# Patient Record
Sex: Female | Born: 1945 | Hispanic: No | Marital: Married | Smoking: Former smoker
Health system: Southern US, Community
[De-identification: ages and names within clinical notes are randomized; demographics above are authoritative.]

## PROBLEM LIST (undated history)

## (undated) DIAGNOSIS — K635 Polyp of colon: Secondary | ICD-10-CM

## (undated) DIAGNOSIS — Z5189 Encounter for other specified aftercare: Secondary | ICD-10-CM

## (undated) DIAGNOSIS — K219 Gastro-esophageal reflux disease without esophagitis: Secondary | ICD-10-CM

## (undated) DIAGNOSIS — E785 Hyperlipidemia, unspecified: Secondary | ICD-10-CM

## (undated) DIAGNOSIS — Z9981 Dependence on supplemental oxygen: Secondary | ICD-10-CM

## (undated) DIAGNOSIS — Z86718 Personal history of other venous thrombosis and embolism: Secondary | ICD-10-CM

## (undated) DIAGNOSIS — K648 Other hemorrhoids: Secondary | ICD-10-CM

## (undated) DIAGNOSIS — D649 Anemia, unspecified: Secondary | ICD-10-CM

## (undated) DIAGNOSIS — I10 Essential (primary) hypertension: Secondary | ICD-10-CM

## (undated) DIAGNOSIS — K5790 Diverticulosis of intestine, part unspecified, without perforation or abscess without bleeding: Secondary | ICD-10-CM

## (undated) HISTORY — PX: CARPAL TUNNEL RELEASE: SHX101

## (undated) HISTORY — DX: Gastro-esophageal reflux disease without esophagitis: K21.9

## (undated) HISTORY — DX: Encounter for other specified aftercare: Z51.89

## (undated) HISTORY — PX: APPENDECTOMY: SHX54

## (undated) HISTORY — DX: Essential (primary) hypertension: I10

## (undated) HISTORY — DX: Other hemorrhoids: K64.8

## (undated) HISTORY — PX: OTHER SURGICAL HISTORY: SHX169

## (undated) HISTORY — PX: TUBAL LIGATION: SHX77

## (undated) HISTORY — DX: Hyperlipidemia, unspecified: E78.5

## (undated) HISTORY — DX: Diverticulosis of intestine, part unspecified, without perforation or abscess without bleeding: K57.90

## (undated) HISTORY — PX: CARDIAC CATHETERIZATION: SHX172

## (undated) HISTORY — DX: Anemia, unspecified: D64.9

## (undated) HISTORY — DX: Polyp of colon: K63.5

## (undated) HISTORY — DX: Personal history of other venous thrombosis and embolism: Z86.718

---
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
## 1968-10-26 HISTORY — PX: OTHER SURGICAL HISTORY: SHX169

## 2007-04-26 ENCOUNTER — Encounter: Payer: Self-pay | Admitting: Cardiovascular Disease

## 2010-09-26 ENCOUNTER — Ambulatory Visit: Payer: Self-pay | Admitting: Family Medicine

## 2010-09-26 ENCOUNTER — Telehealth: Payer: Self-pay | Admitting: Family Medicine

## 2010-09-26 DIAGNOSIS — I10 Essential (primary) hypertension: Secondary | ICD-10-CM | POA: Insufficient documentation

## 2010-09-26 DIAGNOSIS — E785 Hyperlipidemia, unspecified: Secondary | ICD-10-CM | POA: Insufficient documentation

## 2010-09-26 DIAGNOSIS — F341 Dysthymic disorder: Secondary | ICD-10-CM

## 2010-09-26 DIAGNOSIS — M25519 Pain in unspecified shoulder: Secondary | ICD-10-CM

## 2010-09-26 DIAGNOSIS — M79609 Pain in unspecified limb: Secondary | ICD-10-CM

## 2010-10-21 ENCOUNTER — Encounter: Payer: Self-pay | Admitting: Family Medicine

## 2010-10-21 ENCOUNTER — Encounter
Admission: RE | Admit: 2010-10-21 | Discharge: 2010-10-21 | Payer: Self-pay | Source: Home / Self Care | Attending: Family Medicine | Admitting: Family Medicine

## 2010-10-22 LAB — CONVERTED CEMR LAB
ALT: 11 units/L (ref 0–35)
AST: 15 units/L (ref 0–37)
Albumin: 4.3 g/dL (ref 3.5–5.2)
Alkaline Phosphatase: 62 units/L (ref 39–117)
BUN: 20 mg/dL (ref 6–23)
Calcium: 8.8 mg/dL (ref 8.4–10.5)
Chloride: 106 meq/L (ref 96–112)
Glucose, Bld: 93 mg/dL (ref 70–99)
Total Bilirubin: 0.3 mg/dL (ref 0.3–1.2)
Triglycerides: 104 mg/dL (ref <150)

## 2010-11-25 NOTE — Progress Notes (Signed)
Summary: Xray  Phone Note Call from Patient Call back at Home Phone [phone]   Caller: Patient Call For: [place] Summary of Call: Pt calls and states she thought you were gonna give her an xray of arm to get done and doesn't have it with her. Please advise Initial call taken by: Kathlene November LPN,  September 26, 2010 11:03 AM  Follow-up for Phone Call        OK will faxorder. She can go anytime.  Follow-up by: Nani Gasser MD,  September 26, 2010 11:38 AM  Additional Follow-up for Phone Call Additional follow up Details #1::        Pt notified via VM Additional Follow-up by: Kathlene November LPN,  September 26, 2010 11:40 AM  New Problems: ARM PAIN (ICD-729.5)   New Problems: ARM PAIN (ICD-729.5)

## 2010-11-25 NOTE — Assessment & Plan Note (Signed)
Summary: NOV: HTN, lipids, shoulder pain, forearm pain   Vital Signs:  Patient profile:   65 year old female Height:      63.75 inches Weight:      147 pounds Pulse rate:   87 / minute BP sitting:   131 / 81  (right arm) Cuff size:   regular  Vitals Entered By: Avon Gully CMA, Duncan Dull) (September 26, 2010 10:10 AM) CC: NP est care   CC:  NP est care.  History of Present Illness: Tripped on the basement stairs and tripped nand hurt her ankle and also his her forearm. Very tender along her forearm.  Pain has not improved. Also her left upper arm is pianufl. Feels like a throbbing band and radiates up into her neck and will occ keeps her awake and cannot lay on that shoulder at night.  Painful to reach up but has normal ROM. Taking Ibuprofen - helps some.   Habits & Providers  Alcohol-Tobacco-Diet     Alcohol drinks/day: <1     Tobacco Status: quit     Year Quit: 1968  Exercise-Depression-Behavior     Does Patient Exercise: no     STD Risk: never     Drug Use: no     Seat Belt Use: always  Current Medications (verified): 1)  Gemfibrozil 600 Mg Tabs (Gemfibrozil) .... Take One Tablet By Mouth Twice Daily 2)  Prinivil 10 Mg Tabs (Lisinopril) 3)  Fluoxetine Hcl 20 Mg Caps (Fluoxetine Hcl) .... Take One Tablet By Mouth Once A Day  Allergies (verified): 1)  ! Penicillin G Potassium (Penicillin G Potassium) 2)  Simvastatin (Simvastatin)  Comments:  Nurse/Medical Assistant: The patient's medications and allergies were reviewed with the patient and were updated in the Medication and Allergy Lists. Avon Gully CMA, Duncan Dull) (September 26, 2010 10:13 AM)  Past History:  Past Surgical History: tubal ligation [place]  Varicose veins.  Right rotator cuff pain  Family History: Mother with Cancer, DM Father with MI  Social History: Retired.  College education. Married to [place].  Former Smoker, 1968 Alcohol use-yes Drug use-no Regular  exercise-no Smoking Status:  quit Does Patient Exercise:  no STD Risk:  never Drug Use:  no Seat Belt Use:  always  Review of Systems       No fever/sweats/weakness, unexplained weight loss/gain.  No vison changes.  + difficulty hearing/ringing in ears, hay fever/allergies.  + chest pain/discomfort.  No palpitations.  No Br lump/nipple discharge.  No cough/wheeze.  No blood in BM, nausea/vomiting/diarrhea.  + nighttime urination, leaking urine. No unusual vaginal bleeding, discharge (penis or vagina).  + muscle/joint pain. No rash, change in mole.  +  HA. No memory loss.  + anxiety. No sleep d/o, depression.  No easy bruising/bleeding, unexplained lump   Physical Exam  General:  Well-developed,well-nourished,in no acute distress; alert,appropriate and cooperative throughout examination Head:  Normocephalic and atraumatic without obvious abnormalities. No apparent alopecia or balding. Neck:  No deformities, masses, or tenderness noted.no thyromegaly. Lungs:  Normal respiratory effort, chest expands symmetrically. Lungs are clear to auscultation, no crackles or wheezes. Heart:  Normal rate and regular rhythm. S1 and S2 normal without gallop, murmur, click, rub or other extra sounds. the carotid or abdominal bruits Msk:  in bilateral shoulders with normal range of motion.  Strength 5/5 in the shoulders elbows wrists and the thumbs bilaterally.  She is some mild tenderness over the [facility] joint.  She is also tender over the proximal ulna near the  elbow.  She has pinpoint tenderness they are.  No swelling or other skin lesions.  Two the negative empty. can test.  Her neck is nontender.  Her upper cervical spine is slightly tender.  Nontender paraspinous muscles. Pulses:   radial pulses 2+ bilaterally Extremities:  no lower extremity edema Skin:  no rashes.   Cervical Nodes:  No lymphadenopathy noted Psych:  Cognition and judgment appear intact. Alert and cooperative with normal attention span and  concentration. No apparent delusions, illusions, hallucinations   Impression & Recommendations:  Problem # 1:  HYPERTENSION, BENIGN (ICD-401.1) is well controlled today.  sent over refills.she is due for repeat lab work in about a week for her cholesterol Her updated medication list for this problem includes:    Lisinopril 10 Mg Tabs (Lisinopril) .Marland Kitchen... Take 1 tablet by mouth once a day  Orders: T-Comprehensive Metabolic Panel [phone]) T-Lipid Profile ([id number])  Problem # 2:  HYPERLIPIDEMIA (ICD-272.4) repeat lab work in one week for cholesterol.  She did not tolerate simvastatin well. Her updated medication list for this problem includes:    Gemfibrozil 600 Mg Tabs (Gemfibrozil) .Marland Kitchen... Take one tablet by mouth twice daily  Orders: T-Comprehensive Metabolic Panel [id number]) T-Lipid Profile ([id number])  Problem # 3:  SHOULDER PAIN (ICD-719.41) I discussed the suspect that she has subacromial bursitis.  Will give her a handout on exercises to do for her shoulder.  Also for her lower forearm we can do an x-ray to rule out a bone chip. In the meantime she can your certainly use anti-inflammatory p.r.n. with food and water to improve her pain  Problem # 4:  ANXIETY DEPRESSION (ICD-300.4) she is stable and happy with her current regimen.  Problem # 5:  ARM PAIN (ICD-729.5) will get an x-ray to rule out a chip off of the bone.  It is unusual to still be tender 6 months later for just a soft tissue injury.  Complete Medication List: 1)  Gemfibrozil 600 Mg Tabs (Gemfibrozil) .... Take one tablet by mouth twice daily 2)  Lisinopril 10 Mg Tabs (Lisinopril) .... Take 1 tablet by mouth once a day 3)  Fluoxetine Hcl 20 Mg Caps (Fluoxetine hcl) .... Take one tablet by mouth once a day  Other Orders: Flu Vaccine 40yrs + MEDICARE PATIENTS (M8413) Administration Flu vaccine - MCR (K4401)  Patient Instructions: 1)  Please schedule a follow-up appointment in 3-4 months for blood  pressure and we can hopefully get you up to date on some of your screening/preventative care.  2)  We will call you when we get your lab results. .  Prescriptions: LISINOPRIL 10 MG TABS (LISINOPRIL) Take 1 tablet by mouth once a day  #90 x 1   Entered and Authorized by:   Nani Gasser MD   Signed by:   Nani Gasser MD on 09/26/2010   Method used:   Electronically to        [facility] [phone]* (retail)       [address] [place], [place]  [address]       Ph: [phone]       Fax: [phone]   RxID:   [id number] FLUOXETINE HCL 20 MG CAPS (FLUOXETINE HCL) take one tablet by mouth once a day  #90 x 1   Entered and Authorized by:   Nani Gasser MD   Signed by:   Nani Gasser MD on 09/26/2010   Method used:   Electronically to  [address] [phone]* (retail)       [address] [place], [place]  [address]       Ph: [phone]       Fax: [phone]   RxID:   [id number] GEMFIBROZIL 600 MG TABS (GEMFIBROZIL) take one tablet by mouth twice daily  #180 x 1   Entered and Authorized by:   Nani Gasser MD   Signed by:   Nani Gasser MD on 09/26/2010   Method used:   Electronically to        [facility] [phone]* (retail)       [address] [place], [place]  [address]       Ph: [phone]       Fax: [phone]   RxID:   [id number]    Orders Added: 1)  T-Comprehensive Metabolic Panel [[id number]] 2)  T-Lipid Profile [phone] 3)  Flu Vaccine 83yrs + MEDICARE PATIENTS [Q2039] 4)  Administration Flu vaccine - MCR [G0008] 5)  Est. Patient Level IV [35573] Flu Vaccine Consent Questions     Do you have a history of severe allergic reactions to this vaccine? no    Any prior history of allergic reactions to egg and/or gelatin? no    Do you have a sensitivity to the preservative Thimersol? no    Do you have a past history of Guillan-Barre Syndrome? no    Do you currently have an acute febrile illness? no     Have you ever had a severe reaction to latex? no    Vaccine information given and explained to patient? yes    Are you currently pregnant? no    Lot Number:[id number]   Exp Date:04/25/2011   Site Given  Left Deltoid IMu vaccine - MCR [G0008]     .lbmedflu   Appended Document: NOV: HTN, lipids, shoulder pain, forearm pain

## 2011-03-10 ENCOUNTER — Encounter: Payer: Self-pay | Admitting: Family Medicine

## 2011-03-12 ENCOUNTER — Telehealth: Payer: Self-pay | Admitting: Family Medicine

## 2011-03-12 ENCOUNTER — Encounter: Payer: Self-pay | Admitting: Family Medicine

## 2011-03-12 ENCOUNTER — Ambulatory Visit (INDEPENDENT_AMBULATORY_CARE_PROVIDER_SITE_OTHER): Payer: Medicare Other | Admitting: Family Medicine

## 2011-03-12 ENCOUNTER — Ambulatory Visit
Admission: RE | Admit: 2011-03-12 | Discharge: 2011-03-12 | Disposition: A | Discharge status: Home / Self Care | Payer: Medicare Other | Source: Ambulatory Visit | Attending: Family Medicine | Admitting: Family Medicine

## 2011-03-12 VITALS — BP 116/73 | HR 82 | Ht 63.0 in | Wt 144.0 lb

## 2011-03-12 DIAGNOSIS — Z9189 Other specified personal risk factors, not elsewhere classified: Secondary | ICD-10-CM

## 2011-03-12 DIAGNOSIS — Z1231 Encounter for screening mammogram for malignant neoplasm of breast: Secondary | ICD-10-CM

## 2011-03-12 DIAGNOSIS — Z789 Other specified health status: Secondary | ICD-10-CM

## 2011-03-12 DIAGNOSIS — R0602 Shortness of breath: Secondary | ICD-10-CM

## 2011-03-12 DIAGNOSIS — I10 Essential (primary) hypertension: Secondary | ICD-10-CM

## 2011-03-12 LAB — CBC
MCHC: 32.4 g/dL (ref 30.0–36.0)
Platelets: 380 10*3/uL (ref 150–400)

## 2011-03-12 NOTE — Patient Instructions (Signed)
Recheck Blood pressure in one month.   We will call you with the lab results and chest xray results.

## 2011-03-12 NOTE — Telephone Encounter (Signed)
Call pt: the cxr showed some interstitial markings. This could be some scar tissue. Does she know if she had a CXR in the area before? [facility]? If so then find out where and we can get try to get a copy on CD for the radiologist to compare and see if finding are new or not. If not then they recommend Chest CT for further evaluation.

## 2011-03-12 NOTE — Assessment & Plan Note (Signed)
Her blood pressure is normal but on the lower end of normal today. We can go ahead and hold the lisinopril for one month in followup for repeat blood pressure check in one month before she leaves for [place].

## 2011-03-12 NOTE — Progress Notes (Signed)
  Subjective:    Patient ID: Taylor Hall, female    DOB: [birth year], 65 y.o.   MRN: [id number]  HPI SOB for a couple of months.  Gets really SOB with walking on incline.  Occ dry cough.  No fever. Some recent ear infections.  She completed her antibiotics about a week ago. Only smoked as teenager. Had bronchitis as an infant. Mother with COPD (smoker).  Father with Lung Cancer (Black Lung). No CP or diaphoresis.  She doesn't notice the shortness of breath at night or at rest. No swelling of the extremities. No fever.  She had a cardiac cath several years ago and then was supposed to have a nuclear stress test but says she never went because of finances. She would like to sched that now.   Severe burning sensation in the right second toe.  It is intermittant. Next  She also feels her blood pressures been going too low on the lisinopril. She occasionally feels dizzy when she stands up.   Review of Systems     Objective:   Physical Exam  Constitutional: She is oriented to person, place, and time. She appears well-developed and well-nourished.  HENT:  Head: Normocephalic and atraumatic.  Right Ear: External ear normal.  Left Ear: External ear normal.  Nose: Nose normal.  Mouth/Throat: Oropharynx is clear and moist.       Right and left TM and canal are normal.   Eyes: Conjunctivae are normal. Pupils are equal, round, and reactive to light.  Neck: Neck supple. No thyromegaly present.  Cardiovascular: Normal rate, regular rhythm and normal heart sounds.   Pulmonary/Chest: Effort normal and breath sounds normal.  Lymphadenopathy:    She has no cervical adenopathy.  Neurological: She is alert and oriented to person, place, and time.  Skin: Skin is warm and dry.  Psychiatric: She has a normal mood and affect.          Assessment & Plan:  SOB- EKG is normla. Will get CXR to rule out infection or other lesions.. CHF is unlikely as she has no other symptoms of congestive heart failure..  I will check a CBC to rule out anemia. We will also check her thyroid level. Also a CMP to make sure her electrolytes kidney function and renal function are normal. EKG shows rate of 70 bpm, NSR, no acute changes.  Note, she is a very remote smoker.  We will also go ahead and schedule her mammogram and bone density test. She is also due for screening lab work. Her hypertension is well controlled.

## 2011-03-13 ENCOUNTER — Ambulatory Visit
Admission: RE | Admit: 2011-03-13 | Discharge: 2011-03-13 | Disposition: A | Discharge status: Home / Self Care | Payer: Medicare Other | Source: Ambulatory Visit | Attending: Family Medicine | Admitting: Family Medicine

## 2011-03-13 ENCOUNTER — Other Ambulatory Visit: Payer: Medicare Other

## 2011-03-13 ENCOUNTER — Telehealth: Payer: Self-pay | Admitting: Family Medicine

## 2011-03-13 DIAGNOSIS — R0602 Shortness of breath: Secondary | ICD-10-CM

## 2011-03-13 DIAGNOSIS — J849 Interstitial pulmonary disease, unspecified: Secondary | ICD-10-CM | POA: Insufficient documentation

## 2011-03-13 LAB — COMPLETE METABOLIC PANEL WITH GFR
ALT: 10 U/L (ref 0–35)
AST: 16 U/L (ref 0–37)
Albumin: 4.6 g/dL (ref 3.5–5.2)
CO2: 23 mEq/L (ref 19–32)
Calcium: 9.8 mg/dL (ref 8.4–10.5)
Chloride: 103 mEq/L (ref 96–112)
Creat: 0.84 mg/dL (ref 0.40–1.20)
GFR, Est African American: >60 mL/min (ref >60)
Glucose, Bld: 76 mg/dL (ref 70–99)
Total Bilirubin: 0.5 mg/dL (ref 0.3–1.2)

## 2011-03-13 MED ORDER — IOHEXOL 300 MG/ML  SOLN
100.0000 mL | Freq: Once | INTRAMUSCULAR | Status: AC | PRN
  Administered 2011-03-13: 100 mL via INTRAVENOUS

## 2011-03-13 NOTE — Telephone Encounter (Signed)
Dois Davenport from [facility] called and states as she was scheduling pt for mammo and bone density she saw where pt need a chest CT to r/o PE. Dois Davenport is scheduling for today but needs order to say CT angio with/without

## 2011-03-13 NOTE — Telephone Encounter (Signed)
Call patient: Complete metabolic panel looks normal. Her d-dimer which can sometimes indicate a blood clot was elevated. The center chest x-ray was abnormal then I do recommend getting a chest CT scheduled for today for all possible. Her thyroid was normal. She was borderline anemic. Please call the lab and see if they can add an iron level and possibly a folic acid level.

## 2011-03-13 NOTE — Telephone Encounter (Signed)
Patient and her family came in after the CT. We'll review the results. 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 I explained to them that this is more chronic process. I referred him to Dr. Danise Mina for further evaluation and treatment. I did tell her that she did have some borderline anemia. We will add an iron panel to make sure that she does not have an iron deficiency but I do not think that the anemia is in any way causing her shortness of breath. I did give him a copy of the CT.

## 2011-03-13 NOTE — Telephone Encounter (Signed)
Labs added.

## 2011-03-13 NOTE — Telephone Encounter (Signed)
Order changed.

## 2011-03-13 NOTE — Telephone Encounter (Signed)
Pt notified in the office by dr. Linford Arnold

## 2011-03-17 ENCOUNTER — Ambulatory Visit
Admission: RE | Admit: 2011-03-17 | Discharge: 2011-03-17 | Disposition: A | Discharge status: Home / Self Care | Payer: Medicare Other | Source: Ambulatory Visit | Attending: Family Medicine | Admitting: Family Medicine

## 2011-03-17 ENCOUNTER — Other Ambulatory Visit (HOSPITAL_COMMUNITY)
Admission: RE | Admit: 2011-03-17 | Discharge: 2011-03-17 | Disposition: A | Discharge status: Home / Self Care | Payer: Medicare Other | Source: Ambulatory Visit | Attending: Family Medicine | Admitting: Family Medicine

## 2011-03-17 ENCOUNTER — Ambulatory Visit (INDEPENDENT_AMBULATORY_CARE_PROVIDER_SITE_OTHER): Payer: Medicare Other | Admitting: Family Medicine

## 2011-03-17 ENCOUNTER — Encounter: Payer: Self-pay | Admitting: Family Medicine

## 2011-03-17 ENCOUNTER — Inpatient Hospital Stay: Admission: RE | Admit: 2011-03-17 | Payer: Medicare Other | Source: Ambulatory Visit

## 2011-03-17 VITALS — BP 119/77 | HR 78 | Wt 143.0 lb

## 2011-03-17 DIAGNOSIS — Z124 Encounter for screening for malignant neoplasm of cervix: Secondary | ICD-10-CM | POA: Insufficient documentation

## 2011-03-17 DIAGNOSIS — Z01419 Encounter for gynecological examination (general) (routine) without abnormal findings: Secondary | ICD-10-CM

## 2011-03-17 DIAGNOSIS — Z1231 Encounter for screening mammogram for malignant neoplasm of breast: Secondary | ICD-10-CM

## 2011-03-17 DIAGNOSIS — R87810 Cervical high risk human papillomavirus (HPV) DNA test positive: Secondary | ICD-10-CM | POA: Insufficient documentation

## 2011-03-17 DIAGNOSIS — Z9189 Other specified personal risk factors, not elsewhere classified: Secondary | ICD-10-CM

## 2011-03-17 NOTE — Progress Notes (Signed)
  Subjective:    Patient ID: Taylor Hall, female    DOB: 07-07-1946, 65 y.o.   MRN: [id number]  HPI Here for pap only.    Review of Systems     Objective:   Physical Exam  Genitourinary: Vagina normal and uterus normal. There is no rash on the right labia. There is no rash on the left labia. Cervix exhibits no motion tenderness, no discharge and no friability. Right adnexum displays no mass, no tenderness and no fullness. Left adnexum displays no mass, no tenderness and no fullness.          Assessment & Plan:  Pap performed. Will call with results. If normal repeat in 3 years.

## 2011-03-19 ENCOUNTER — Ambulatory Visit (INDEPENDENT_AMBULATORY_CARE_PROVIDER_SITE_OTHER): Payer: Medicare Other | Admitting: Critical Care Medicine

## 2011-03-19 ENCOUNTER — Other Ambulatory Visit: Payer: Self-pay | Admitting: Critical Care Medicine

## 2011-03-19 ENCOUNTER — Encounter: Payer: Self-pay | Admitting: Critical Care Medicine

## 2011-03-19 ENCOUNTER — Telehealth: Payer: Self-pay | Admitting: *Deleted

## 2011-03-19 DIAGNOSIS — J849 Interstitial pulmonary disease, unspecified: Secondary | ICD-10-CM

## 2011-03-19 DIAGNOSIS — K219 Gastro-esophageal reflux disease without esophagitis: Secondary | ICD-10-CM

## 2011-03-19 DIAGNOSIS — R0602 Shortness of breath: Secondary | ICD-10-CM

## 2011-03-19 DIAGNOSIS — J84115 Respiratory bronchiolitis interstitial lung disease: Secondary | ICD-10-CM

## 2011-03-19 MED ORDER — OMEPRAZOLE 20 MG PO CPDR: 20.0000 mg | DELAYED_RELEASE_CAPSULE | Freq: Every day | ORAL | Status: DC

## 2011-03-19 MED ORDER — ACETYLCYSTEINE 600 MG PO CAPS: 1.0000 | ORAL_CAPSULE | Freq: Three times a day (TID) | ORAL | Status: DC

## 2011-03-19 NOTE — Telephone Encounter (Signed)
OK will refer to GI.

## 2011-03-19 NOTE — Progress Notes (Deleted)
Subjective:    Patient ID: Taylor Hall, female    DOB: 11-29-1945, 65 y.o.   MRN: [id number]  Shortness of Breath   65 y.o. WF referred for eval of dyspnea.  Past Medical History  Diagnosis Date  . Hypertension   . Hyperlipemia      Family History  Problem Relation Age of Onset  . Cancer Mother   . Diabetes Mother   . 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 COPD Mother   . Allergies Mother   . Allergies Brother   . Heart disease Father      History   Social History  . Marital Status: Married    Spouse Name: N/A    Number of Children: 2  . Years of Education: N/A   Occupational History  . Retired    Social History Main Topics  . Smoking status: Former Smoker -- 1.0 packs/day for 30 years    Types: Cigarettes    Quit date: 10/26/1980  . Smokeless tobacco: Never Used  . Alcohol Use: Yes     glass of wine  . Drug Use: No  . Sexually Active: Not on file     retired, college education, married, does not regularly exercise   Other Topics Concern  . Not on file   Social History Narrative  . No narrative on file     Allergies  Allergen Reactions  . Penicillins   . Simvastatin     REACTION: myalgias     Outpatient Prescriptions Prior to Visit  Medication Sig Dispense Refill  . FLUoxetine (PROZAC) 20 MG capsule Take 20 mg by mouth daily.        Marland Kitchen gemfibrozil (LOPID) 600 MG tablet Take 600 mg by mouth 2 (two) times daily.            Review of Systems  Respiratory: Positive for shortness of breath.    Constitutional:   No  weight loss, night sweats,  Fevers, chills, fatigue, lassitude. HEENT:   No headaches,  Difficulty swallowing,  Tooth/dental problems,  Sore throat,                No sneezing, itching, ear ache, nasal congestion, post nasal drip,   CV:  No chest pain,  Orthopnea, PND, swelling in lower extremities, anasarca, dizziness, palpitations  GI  No heartburn, indigestion, abdominal pain, nausea, vomiting, diarrhea,  change in bowel habits, loss of appetite  Resp: No shortness of breath with exertion or at rest.  No excess mucus, no productive cough,  No non-productive cough,  No coughing up of blood.  No change in color of mucus.  No wheezing.  No chest wall deformity  Skin: no rash or lesions.  GU: no dysuria, change in color of urine, no urgency or frequency.  No flank pain.  MS:  No joint pain or swelling.  No decreased range of motion.  No back pain.  Psych:  No change in mood or affect. No depression or anxiety.  No memory loss.     Objective:   Physical Exam Filed Vitals:   03/19/11 0921  BP: 130/80  Pulse: 77  Height: 5\' 3"  (1.6 m)  Weight: 143 lb (64.864 kg)  SpO2: 98%    Gen: Pleasant, well-nourished, in no distress,  normal affect  ENT: No lesions,  mouth clear,  oropharynx clear, no postnasal drip  Neck: No JVD, no TMG, no carotid bruits  Lungs: No use of accessory  muscles, no dullness to percussion, clear without rales or rhonchi  Cardiovascular: RRR, heart sounds normal, no murmur or gallops, no peripheral edema  Abdomen: soft and NT, no HSM,  BS normal  Musculoskeletal: No deformities, no cyanosis or clubbing  Neuro: alert, non focal  Skin: Warm, no lesions or rashes        Assessment & Plan:

## 2011-03-19 NOTE — Assessment & Plan Note (Addendum)
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  Hx of GERD and smoking as two ppt factors. No active disease seen on CT scan.  Plan Serology Full PFTs Start NAC 600mg  tid Screen for pirfenidone/Ascend trial Reflux diet Omeprazole 20mg  daily No other med changes Pneumovax

## 2011-03-19 NOTE — Patient Instructions (Signed)
Start N acetylcysteine one three times daily  Can get at [facility] or other vitamin store Omeprazole one daily 1/2 hour before meal then eat Full pulmonary function study and 6 min walk Labs today You will be screened for the Ascend trial Pirfenidone Return 2 months [place]

## 2011-03-19 NOTE — Telephone Encounter (Addendum)
Pt called and states she went to the pulmonologist and he advised that she may need to see a GI doc because this may by causing some of her breathing issues.Pt would like a referral to GI

## 2011-03-19 NOTE — Progress Notes (Signed)
Subjective:     Patient ID: Taylor Hall, female   DOB: 1946-01-08, 65 y.o.   MRN: [id number] 65 y.o.WF referred for Pulm fibrosis eval. Hx of dyspnea as below Shortness of Breath This is a chronic (Over past july 2011, uphill at 3068ft became dyspneic.  Elevation issues.  Came on suddenly at this point. ) problem. The current episode started more than 1 year ago. Episode frequency: Notes dyspnea only with exertion,  comes and goes.  Ok at rest  The problem has been unchanged. Duration: Will recover in a few minutes. Associated symptoms include chest pain. Pertinent negatives include no abdominal pain, ear pain, fever, headaches, hemoptysis, leg pain, leg swelling, neck pain, orthopnea, PND, rash, rhinorrhea, sore throat, sputum production, swollen glands, syncope, vomiting or wheezing. The symptoms are aggravated by any activity, fumes and smoke (Dyspnea now up incline, swimming ,  ok on flat surfaces.  Will have dypsnea if push knees to chest ). Associated symptoms comments: Has heartburn but is not frequent.  Has hiatal hernia.  If goes uphill will get chest discomfort.  Non radiating. Cath done 2008 and neg.  Has a dry cough,  Not consistent.   Was on ace inhibitor until one week ago. No joint pain or arthritis. She has tried nothing for the symptoms. Her past medical history is significant for allergies and bronchiolitis. There is no history of CAD, COPD, a heart failure, PE, pneumonia or a recent surgery. (Bronchiolitis as a child)   Past Medical History  Diagnosis Date  . Hypertension   . Hyperlipemia      Family History  Problem Relation Age of Onset  . Cancer Mother   . Diabetes Mother   . 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 COPD Mother   . Allergies Mother   . Allergies Brother   . Heart disease Father      History   Social History  . Marital Status: Married    Spouse Name: N/A    Number of Children: 2  . Years of Education: N/A   Occupational  History  . Retired    Social History Main Topics  . Smoking status: Former Smoker -- 1.0 packs/day for 30 years    Types: Cigarettes    Quit date: 10/26/1980  . Smokeless tobacco: Never Used  . Alcohol Use: Yes     glass of wine  . Drug Use: No  . Sexually Active: Not on file     retired, college education, married, does not regularly exercise   Other Topics Concern  . Not on file   Social History Narrative  . No narrative on file     Allergies  Allergen Reactions  . Penicillins   . Simvastatin     REACTION: myalgias     Outpatient Prescriptions Prior to Visit  Medication Sig Dispense Refill  . FLUoxetine (PROZAC) 20 MG capsule Take 20 mg by mouth daily.        Marland Kitchen gemfibrozil (LOPID) 600 MG tablet Take 600 mg by mouth 2 (two) times daily.             Review of Systems  Constitutional: Positive for fatigue. Negative for fever, chills, diaphoresis, activity change, appetite change and unexpected weight change.  HENT: Positive for hearing loss, sneezing, voice change and postnasal drip. Negative for ear pain, nosebleeds, congestion, sore throat, facial swelling, rhinorrhea, mouth sores, trouble swallowing, neck pain, neck stiffness, dental problem, sinus pressure, tinnitus  and ear discharge.   Eyes: Positive for itching. Negative for photophobia, discharge and visual disturbance.  Respiratory: Positive for cough, chest tightness and shortness of breath. Negative for apnea, hemoptysis, sputum production, choking, wheezing and stridor.   Cardiovascular: Positive for chest pain. Negative for palpitations, orthopnea, leg swelling, syncope and PND.  Gastrointestinal: Positive for constipation. Negative for nausea, vomiting, abdominal pain, blood in stool and abdominal distention.  Genitourinary: Negative for dysuria, urgency, frequency, hematuria, flank pain, decreased urine volume and difficulty urinating.  Musculoskeletal: Negative for myalgias, back pain, joint swelling,  arthralgias and gait problem.  Skin: Negative for color change, pallor and rash.  Neurological: Positive for light-headedness. Negative for dizziness, tremors, seizures, syncope, speech difficulty, weakness, numbness and headaches.  Hematological: Negative for adenopathy. Does not bruise/bleed easily.  Psychiatric/Behavioral: Negative for confusion, sleep disturbance and agitation. The patient is not nervous/anxious.        Objective:   Physical Exam Filed Vitals:   03/19/11 0921  BP: 130/80  Pulse: 77  Temp: 97.7 F (36.5 C)  TempSrc: Oral  Height: 5\' 3"  (1.6 m)  Weight: 143 lb (64.864 kg)  SpO2: 98%    Gen: Pleasant, well-nourished, in no distress,  normal affect  ENT: No lesions,  mouth clear,  oropharynx clear, no postnasal drip  Neck: No JVD, no TMG, no carotid bruits  Lungs: No use of accessory muscles, no dullness to percussion, distant BS, dry rales  Cardiovascular: RRR, heart sounds normal, no murmur or gallops, no peripheral edema  Abdomen: soft and NT, no HSM,  BS normal  Musculoskeletal: No deformities, no cyanosis or clubbing  Neuro: alert, non focal  Skin: Warm, no lesions or rashes  CT chest 03/13/11: Findings:  There are no enlarged axillary lymph nodes.  There is no enlarged supraclavicular adenopathy.  No enlarged mediastinal lymph nodes. Prominent bilateral hilar  lymph nodes identified. Left hilar lymph node measures 1.1 cm,  image 56. Right hilar lymph node measures 1.0 cm, image 55.  The patient has a large hiatal hernia.  There are no abnormal filling defects within the main pulmonary  artery or its branches to suggest an acute pulmonary embolus.  There is bilateral, peripheral predominant interstitial  reticulation and subpleural honeycombing consistent with pulmonary  fibrosis.  No airspace consolidation identified.  No suspicious pulmonary parenchymal nodules or masses identified.  Review of the visualized osseous structures is  unremarkable.  Limited imaging through the upper abdomen shows a small low density  structure within the right hepatic lobe measuring 0.9 cm, image 87.  Also in the right hepatic lobe there is a 0.5 cm low density  structure, image 94.  Review of the MIP images confirms the above findings.  IMPRESSION:  1. There is no evidence for acute pulmonary embolus.  2. 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  3. Enlarged bilateral hilar lymph nodes. Nonspecific and likely  reactive in the setting of interstitial lung disease.      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  Hx of GERD and smoking as two ppt factors. No active disease seen on CT scan.  Plan Serology Full PFTs Start NAC 600mg  tid Screen for pirfenidone/[facility] trial Reflux diet Omeprazole 20mg  daily No other med changes Pneumovax    Updated Medication List Outpatient Encounter Prescriptions as of 03/19/2011  Medication Sig Dispense Refill  . Calcium-Vitamin D 600-125 MG-UNIT TABS Take 1 tablet by mouth daily.        Marland Kitchen  FLUoxetine (PROZAC) 20 MG capsule Take 20 mg by mouth daily.        Marland Kitchen gemfibrozil (LOPID) 600 MG tablet Take 600 mg by mouth 2 (two) times daily.        Marland Kitchen omeprazole (PRILOSEC) 20 MG capsule Take 1 capsule (20 mg total) by mouth daily.  30 capsule  6  . DISCONTD: omeprazole (PRILOSEC) 20 MG capsule Take 20 mg by mouth daily as needed.        . Acetylcysteine 600 MG CAPS Take 1 capsule (600 mg total) by mouth 3 (three) times daily.  90 capsule  0      Plan:

## 2011-03-20 ENCOUNTER — Ambulatory Visit: Payer: Medicare Other | Admitting: Cardiovascular Disease

## 2011-03-20 LAB — SJOGRENS SYNDROME-A EXTRACTABLE NUCLEAR ANTIBODY: SSA (Ro) (ENA) Antibody, IgG: <1 AU/mL (ref <30)

## 2011-03-20 LAB — ANTI-SCLERODERMA ANTIBODY: Scleroderma (Scl-70) (ENA) Antibody, IgG: <1 AU/mL (ref <30)

## 2011-03-20 LAB — SJOGRENS SYNDROME-B EXTRACTABLE NUCLEAR ANTIBODY: SSB (La) (ENA) Antibody, IgG: <1 AU/mL (ref <30)

## 2011-03-20 LAB — ANA: Anti Nuclear Antibody(ANA): NEGATIVE

## 2011-03-20 MED ORDER — PNEUMOCOCCAL VAC POLYVALENT 25 MCG/0.5ML IJ INJ
0.5000 mL | INJECTION | Freq: Once | INTRAMUSCULAR | Status: AC
  Administered 2011-03-19: 0.5 mL via INTRAMUSCULAR

## 2011-03-20 MED ORDER — PNEUMOCOCCAL VAC POLYVALENT 25 MCG/0.5ML IJ INJ
0.5000 mL | INJECTION | Freq: Once | INTRAMUSCULAR | Status: DC
  Administered 2011-03-19: 0.5 mL via INTRAMUSCULAR

## 2011-03-20 NOTE — Progress Notes (Signed)
Addended by: Gweneth Dimitri D on: 03/20/2011 12:10 PM   Modules accepted: Orders

## 2011-03-20 NOTE — Telephone Encounter (Signed)
Left message on vm

## 2011-03-24 LAB — HYPERSENSITIVITY PNUEMONITIS PROFILE

## 2011-03-24 NOTE — Progress Notes (Signed)
Quick Note:  Call pt and tell her labs are ok, No change in medications I tried to call this patient and no answer  Try her later and tell her all serology is negative for autoimmune cause  I will call her myself later in week ______

## 2011-03-25 ENCOUNTER — Telehealth: Payer: Self-pay | Admitting: Family Medicine

## 2011-03-25 NOTE — Progress Notes (Signed)
Quick Note:  Called, spoke with pt. She was informed of lab results and recs per PW. She verbalized understanding of this. ______ 

## 2011-03-25 NOTE — Telephone Encounter (Signed)
Call pt: nl pap. 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

## 2011-03-26 ENCOUNTER — Encounter: Payer: Self-pay | Admitting: Cardiovascular Disease

## 2011-03-26 ENCOUNTER — Ambulatory Visit (INDEPENDENT_AMBULATORY_CARE_PROVIDER_SITE_OTHER): Payer: Medicare Other | Admitting: Cardiovascular Disease

## 2011-03-26 ENCOUNTER — Telehealth: Payer: Self-pay | Admitting: Family Medicine

## 2011-03-26 ENCOUNTER — Institutional Professional Consult (permissible substitution): Payer: Medicare Other | Admitting: Critical Care Medicine

## 2011-03-26 DIAGNOSIS — K635 Polyp of colon: Secondary | ICD-10-CM

## 2011-03-26 DIAGNOSIS — K648 Other hemorrhoids: Secondary | ICD-10-CM

## 2011-03-26 DIAGNOSIS — J84115 Respiratory bronchiolitis interstitial lung disease: Secondary | ICD-10-CM

## 2011-03-26 DIAGNOSIS — E785 Hyperlipidemia, unspecified: Secondary | ICD-10-CM

## 2011-03-26 DIAGNOSIS — I517 Cardiomegaly: Secondary | ICD-10-CM | POA: Insufficient documentation

## 2011-03-26 DIAGNOSIS — I10 Essential (primary) hypertension: Secondary | ICD-10-CM

## 2011-03-26 DIAGNOSIS — J849 Interstitial pulmonary disease, unspecified: Secondary | ICD-10-CM

## 2011-03-26 HISTORY — DX: Other hemorrhoids: K64.8

## 2011-03-26 HISTORY — DX: Polyp of colon: K63.5

## 2011-03-26 NOTE — Assessment & Plan Note (Signed)
F/U Dr Delford Field  She is going to [place] anyway but should be able to start non FDA approved drug for ILD via [facility] pharmacy

## 2011-03-26 NOTE — Assessment & Plan Note (Signed)
F/U echo to assess RV/LV function and R/O pulmonary HTN

## 2011-03-26 NOTE — Assessment & Plan Note (Addendum)
Well controlled.  Continue current medications and low sodium Dash type diet.  .  I would keep her on low dose ACE

## 2011-03-26 NOTE — Assessment & Plan Note (Signed)
Cholesterol is at goal.  Continue current dose of statin and diet Rx.  No myalgias or side effects.  F/U  LFT's in 6 months. Lab Results  Component Value Date   LDLCALC 98 10/21/2010

## 2011-03-26 NOTE — Patient Instructions (Signed)
Your physician recommends that you schedule a follow-up appointment in: AS NEEDED  Your physician has requested that you have an echocardiogram. Echocardiography is a painless test that uses sound waves to create images of your heart. It provides your doctor with information about the size and shape of your heart and how well your heart's chambers and valves are working. This procedure takes approximately one hour. There are no restrictions for this procedure. AT PT'S CONVENIENCE DX CARDIOMEAGALY

## 2011-03-26 NOTE — Progress Notes (Signed)
65 y o referred for cardiomegaly on CT.  No previous heart disease.  Normal cath in [facility] 4 years ago.  Recent Dx of ILD.  Going to [place] to get non FDA approved drug.  She is [place] and I suspect with a script from Dr Delford Field she could get it on line at [facility].  Has had some increase in dyspnea.  No SSCP, palpitations, edema or CHF.  BP has been labile and lisinopril recently stopped.  Reviewed CT and showed adenopathy and ILD but I was not impressed with CE.  CRF; include HTN and elevated lipids.    ROS: Denies fever, malais, weight loss, blurry vision, decreased visual acuity, cough, sputum, SOB, hemoptysis, pleuritic pain, palpitaitons, heartburn, abdominal pain, melena, lower extremity edema, claudication, or rash.   General: Affect appropriate Healthy:  appears stated age HEENT: normal Neck supple with no adenopathy JVP normal no bruits no thyromegaly Lungs clear with no wheezing and good diaphragmatic motion Heart:  S1/S2 no murmur,rub, gallop or click PMI normal Abdomen: benighn, BS positve, no tenderness, no AAA no bruit.  No HSM or HJR Distal pulses intact with no bruits No edema Neuro non-focal Skin warm and dry No muscular weakness  Medications Current Outpatient Prescriptions  Medication Sig Dispense Refill  . Acetylcysteine 600 MG CAPS Take 1 capsule (600 mg total) by mouth 3 (three) times daily.  90 capsule  0  . Calcium-Vitamin D 600-125 MG-UNIT TABS Take 1 tablet by mouth daily.        Marland Kitchen FLUoxetine (PROZAC) 20 MG capsule Take 20 mg by mouth daily.        Marland Kitchen gemfibrozil (LOPID) 600 MG tablet Take 600 mg by mouth 2 (two) times daily.        Marland Kitchen omeprazole (PRILOSEC) 20 MG capsule Take 1 capsule (20 mg total) by mouth daily.  30 capsule  6    Allergies Penicillins and Simvastatin  Family History: Family History  Problem Relation Age of Onset  . Cancer Mother   . Diabetes Mother   . 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 COPD Mother     . Allergies Mother   . Allergies Brother   . Heart disease Father     Social History: History   Social History  . Marital Status: Married    Spouse Name: N/A    Number of Children: 2  . Years of Education: N/A   Occupational History  . Retired    Social History Main Topics  . Smoking status: Former Smoker -- 1.0 packs/day for 30 years    Types: Cigarettes    Quit date: 10/26/1980  . Smokeless tobacco: Never Used  . Alcohol Use: Yes     glass of wine  . Drug Use: No  . Sexually Active: Not on file     retired, college education, married, does not regularly exercise   Other Topics Concern  . Not on file   Social History Narrative  . No narrative on file    Electrocardiogram:  NSR 76 LAE otherwise normal  Assessment and Plan

## 2011-03-27 NOTE — Telephone Encounter (Signed)
A user error has taken place: encounter opened in error, closed for administrative reasons.

## 2011-03-27 NOTE — Telephone Encounter (Signed)
Pt aware of the above  

## 2011-03-30 ENCOUNTER — Ambulatory Visit (INDEPENDENT_AMBULATORY_CARE_PROVIDER_SITE_OTHER): Payer: Medicare Other | Admitting: Critical Care Medicine

## 2011-03-30 ENCOUNTER — Telehealth: Payer: Self-pay | Admitting: *Deleted

## 2011-03-30 DIAGNOSIS — R0600 Dyspnea, unspecified: Secondary | ICD-10-CM

## 2011-03-30 DIAGNOSIS — J849 Interstitial pulmonary disease, unspecified: Secondary | ICD-10-CM

## 2011-03-30 DIAGNOSIS — J84115 Respiratory bronchiolitis interstitial lung disease: Secondary | ICD-10-CM

## 2011-03-30 DIAGNOSIS — R0609 Other forms of dyspnea: Secondary | ICD-10-CM

## 2011-03-30 LAB — PULMONARY FUNCTION TEST

## 2011-03-30 NOTE — Telephone Encounter (Signed)
While in office today for SMW and PFTs, pt has questions for Dr. Delford Field. 1.  Pt leaving June 10 for [place].  States she was told by Dr. Eden Emms to see if PW will write rx for pirfenidone to take to [place] to get the medication.   2.  ? Should she have an abx to carry with her to [place] 3.  ? Does she need supplemental o2 while on the plane.  Advised pt PW is off this week but will forward message to him to see if he looks in his box.  She will call office back on Wednesday or Thursday if she has not heard anything back to see if another provider will feel comfortable addressing this.  Pt verbalized understanding of this and is ok with this plan.

## 2011-03-30 NOTE — Telephone Encounter (Signed)
LMTCB

## 2011-03-30 NOTE — Telephone Encounter (Signed)
She does not need oxygen on plane I cannot write an RX for pirfenidone that will be honored in [place], she will have to see a provider in [place], she is welcome to take copies of all records with her to [place] Give her 5 avelox 400mg  samples to take to [place] sig is one daily if she produces mucus

## 2011-03-30 NOTE — Progress Notes (Signed)
PFT done today. 

## 2011-03-31 ENCOUNTER — Ambulatory Visit: Admission: RE | Admit: 2011-03-31 | Payer: Medicare Other | Source: Ambulatory Visit

## 2011-03-31 ENCOUNTER — Ambulatory Visit (HOSPITAL_COMMUNITY): Payer: Medicare Other | Attending: Cardiovascular Disease | Admitting: Radiology

## 2011-03-31 ENCOUNTER — Telehealth: Payer: Self-pay | Admitting: Family Medicine

## 2011-03-31 DIAGNOSIS — I079 Rheumatic tricuspid valve disease, unspecified: Secondary | ICD-10-CM | POA: Insufficient documentation

## 2011-03-31 DIAGNOSIS — E785 Hyperlipidemia, unspecified: Secondary | ICD-10-CM | POA: Insufficient documentation

## 2011-03-31 DIAGNOSIS — I517 Cardiomegaly: Secondary | ICD-10-CM | POA: Insufficient documentation

## 2011-03-31 DIAGNOSIS — I059 Rheumatic mitral valve disease, unspecified: Secondary | ICD-10-CM | POA: Insufficient documentation

## 2011-03-31 DIAGNOSIS — I379 Nonrheumatic pulmonary valve disorder, unspecified: Secondary | ICD-10-CM | POA: Insufficient documentation

## 2011-03-31 DIAGNOSIS — I10 Essential (primary) hypertension: Secondary | ICD-10-CM | POA: Insufficient documentation

## 2011-03-31 NOTE — Telephone Encounter (Signed)
Pt chart was reviewed but the last labs in [facility] are showing from Dr. Shan Levans.  I left mess on pt number explaining such. Jarvis Newcomer, LPN [facility] Dimes

## 2011-03-31 NOTE — Telephone Encounter (Signed)
Pt called and is requesting to pick up a copy of her labs. Plan:  Labs printed off so pt could pick up Jarvis Newcomer, LPN [place] Dimes

## 2011-03-31 NOTE — Telephone Encounter (Signed)
Returning call.  [phone]

## 2011-03-31 NOTE — Telephone Encounter (Signed)
Called spoke with patient, advised of PEW's recs.  When I informed pt that PEW will not be able to write the rx for the pirfenidone to take to [place] pt became persistent that yes, she will be able to take a written rx with her to either the [place] or [place] that will be honored.  Pt states that the rx is to verify that she qualifies for the trial and requested that this be relayed back to PEW.  I again informed pt that PEW is not in the office this week but I will forward the message to him.  If patient does not hear from our office within the next couple of days she will call.    5 days samples avelox left up front for pt to pick up at her convenience.

## 2011-04-01 ENCOUNTER — Telehealth: Payer: Self-pay | Admitting: Cardiovascular Disease

## 2011-04-01 ENCOUNTER — Telehealth: Payer: Self-pay | Admitting: *Deleted

## 2011-04-01 NOTE — Telephone Encounter (Addendum)
ROI faxed to [facility] @ [phone]  04/01/11/km  Records received from [facility] to [facility]  04/01/11/km

## 2011-04-01 NOTE — Telephone Encounter (Signed)
I have not done this before.  I do not think an RX by a [place] MD of an unapproved med will be honored I will consider this and call her next week

## 2011-04-01 NOTE — Telephone Encounter (Signed)
Called, spoke with pt.  She was informed this med is still unapproved in the [place].  Therefore, rx by a [place] MD will probably not be honored.  She verbalized understanding of this and is aware PW will consider this and will call her next week once he returns to office.  She is requesting I inform PW she will be leaving on June 10 but will return on June 26.  Also requesting I thank PW for addressing this and for all his help.  Will forward message to PW as FYI ONLY.

## 2011-04-01 NOTE — Telephone Encounter (Signed)
`  LM on identified answering machine that ECHO was normal--nt

## 2011-04-02 ENCOUNTER — Telehealth: Payer: Self-pay | Admitting: *Deleted

## 2011-04-02 ENCOUNTER — Other Ambulatory Visit (HOSPITAL_COMMUNITY): Payer: Medicare Other

## 2011-04-02 NOTE — Telephone Encounter (Signed)
Per CY-normal spirometry flows and insignificant response to bronchodilator; normal lung volumes, diffusion moderately reduced.

## 2011-04-02 NOTE — Telephone Encounter (Signed)
Per epic, pt had PFT done one Monday.  However, PEW has been out of the office all week but will return on Monday 6.11.12.  LMOM TCB x1.

## 2011-04-02 NOTE — Telephone Encounter (Signed)
Spoke with pt and advised her that PW out of the office until 04/06/11 and that we will advise him that she is requesting PFT results. Pt states needs someone to read results sooner than this, and then her phone started breaking up and we were disconnected. Will await a call back.

## 2011-04-02 NOTE — Telephone Encounter (Signed)
Pt returning call.Taylor Hall ° °

## 2011-04-02 NOTE — Telephone Encounter (Signed)
Spoke with pt and notified of results per CDY. She verbalized understanding and denied any further questions. Will forward to PW so he is aware.

## 2011-04-02 NOTE — Telephone Encounter (Signed)
Spoke with pt. She states that she is needing someone to read these results sooner than this b/c she is going to [place] and needs to give her doc there these results. She is leaving this wkend. She states that Crystal spoke with her before about this and she told her this would not be an issue. Will forward this to doc of the day. Please advise thanks!

## 2011-04-03 ENCOUNTER — Telehealth: Payer: Self-pay | Admitting: Critical Care Medicine

## 2011-04-03 NOTE — Telephone Encounter (Signed)
noted 

## 2011-04-03 NOTE — Telephone Encounter (Signed)
Spoke with pt and advised PFT's ready to be picked up. Pt verbalized understanding.

## 2011-04-03 NOTE — Telephone Encounter (Signed)
pft was reviewed by CDY on 6.7.12 as PEW is out of the office this week.  i have made a copy of the pft with CDY's interpretation written on it and have it in an envelope in triage.  LMOM TCB x1.

## 2011-04-06 ENCOUNTER — Other Ambulatory Visit (HOSPITAL_COMMUNITY): Payer: Medicare Other

## 2011-04-06 NOTE — Progress Notes (Signed)
Pt came for walk/pfts

## 2011-04-09 ENCOUNTER — Telehealth: Payer: Self-pay | Admitting: Family Medicine

## 2011-04-09 NOTE — Telephone Encounter (Signed)
Please call patient. Normal mammogram.  Repeat in 1 year.  

## 2011-04-10 ENCOUNTER — Encounter: Payer: Self-pay | Admitting: Family Medicine

## 2011-04-13 NOTE — Telephone Encounter (Signed)
LMOM advising pt of mammy result.

## 2011-04-20 ENCOUNTER — Encounter: Payer: Self-pay | Admitting: Internal Medicine

## 2011-05-19 ENCOUNTER — Telehealth: Payer: Self-pay | Admitting: Critical Care Medicine

## 2011-05-19 NOTE — Telephone Encounter (Signed)
Spoke with pt. She states that she needs appt with PW to discuss "source for perfinidone"- as this was discussed at last ov, and also b/c she had a cold while on vacation in [place] and had to take avelox. States doing better now, but does feel like her lung function may have changed. OV with PW sched for 05/25/11 at noon.

## 2011-05-25 ENCOUNTER — Encounter: Payer: Self-pay | Admitting: Critical Care Medicine

## 2011-05-25 ENCOUNTER — Ambulatory Visit (INDEPENDENT_AMBULATORY_CARE_PROVIDER_SITE_OTHER): Payer: Medicare Other | Admitting: Critical Care Medicine

## 2011-05-25 VITALS — BP 144/92 | HR 74 | Temp 98.0°F | Ht 63.0 in | Wt 146.0 lb

## 2011-05-25 DIAGNOSIS — J849 Interstitial pulmonary disease, unspecified: Secondary | ICD-10-CM

## 2011-05-25 DIAGNOSIS — J8410 Pulmonary fibrosis, unspecified: Secondary | ICD-10-CM

## 2011-05-25 DIAGNOSIS — J84115 Respiratory bronchiolitis interstitial lung disease: Secondary | ICD-10-CM

## 2011-05-25 MED ORDER — AMBULATORY NON FORMULARY MEDICATION: Status: DC

## 2011-05-25 NOTE — Progress Notes (Signed)
Subjective:    Patient ID: Taylor Hall, female    DOB: 1946-03-05, 65 y.o.   MRN: [id number]  HPI  65 y.o.  WF with ILD uip/ipf  CT dx only, no tissue dx.  Now on pirfenidone via [place] MD Pt denies any significant sore throat, nasal congestion or excess secretions, fever, chills, sweats, unintended weight loss, pleurtic or exertional chest pain, orthopnea PND, or leg swelling Pt denies any increase in rescue therapy over baseline, denies waking up needing it or having any early am or nocturnal exacerbations of coughing/wheezing/or dyspnea. Pt also denies any obvious fluctuation in symptoms with  weather or environmental change or other alleviating or aggravating factors  Past Medical History  Diagnosis Date  . Hypertension   . Hyperlipemia      Family History  Problem Relation Age of Onset  . Cancer Mother   . Diabetes Mother   . 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 COPD Mother   . Allergies Mother   . Allergies Brother   . Heart disease Father      History   Social History  . Marital Status: Married    Spouse Name: N/A    Number of Children: 2  . Years of Education: N/A   Occupational History  . Retired    Social History Main Topics  . Smoking status: Former Smoker -- 1.0 packs/day for 30 years    Types: Cigarettes    Quit date: 10/26/1980  . Smokeless tobacco: Never Used  . Alcohol Use: Yes     glass of wine  . Drug Use: No  . Sexually Active: Not on file     retired, college education, married, does not regularly exercise   Other Topics Concern  . Not on file   Social History Narrative  . No narrative on file     Allergies  Allergen Reactions  . Penicillins   . Simvastatin     REACTION: myalgias     Outpatient Prescriptions Prior to Visit  Medication Sig Dispense Refill  . Acetylcysteine 600 MG CAPS Take 1 capsule (600 mg total) by mouth 3 (three) times daily.  90 capsule  0  . Calcium-Vitamin D 600-125 MG-UNIT TABS  Take 1 tablet by mouth daily.        Marland Kitchen FLUoxetine (PROZAC) 20 MG capsule Take 20 mg by mouth daily.        Marland Kitchen gemfibrozil (LOPID) 600 MG tablet Take 600 mg by mouth 2 (two) times daily.        Marland Kitchen omeprazole (PRILOSEC) 20 MG capsule Take 1 capsule (20 mg total) by mouth daily.  30 capsule  6       Review of Systems Constitutional:   No  weight loss, night sweats,  Fevers, chills, fatigue, lassitude. HEENT:   No headaches,  Difficulty swallowing,  Tooth/dental problems,  Sore throat,                No sneezing, itching, ear ache, nasal congestion, post nasal drip,   CV:  No chest pain,  Orthopnea, PND, swelling in lower extremities, anasarca, dizziness, palpitations  GI  No heartburn, indigestion, abdominal pain, nausea, vomiting, diarrhea, change in bowel habits, loss of appetite  Resp: Notes  shortness of breath with exertion not  at rest.  No excess mucus, no productive cough,  No non-productive cough,  No coughing up of blood.  No change in color of mucus.  No wheezing.  No chest wall deformity  Skin: no rash or lesions.  GU: no dysuria, change in color of urine, no urgency or frequency.  No flank pain.  MS:  No joint pain or swelling.  No decreased range of motion.  No back pain.  Psych:  No change in mood or affect. No depression or anxiety.  No memory loss.     Objective:   Physical Exam  Filed Vitals:   05/25/11 1157  BP: 144/92  Pulse: 74  Temp: 98 F (36.7 C)  TempSrc: Oral  Height: 5\' 3"  (1.6 m)  Weight: 146 lb (66.225 kg)  SpO2: 96%    Gen: Pleasant, well-nourished, in no distress,  normal affect  ENT: No lesions,  mouth clear,  oropharynx clear, no postnasal drip  Neck: No JVD, no TMG, no carotid bruits  Lungs: No use of accessory muscles, no dullness to percussion, dry rales  Cardiovascular: RRR, heart sounds normal, no murmur or gallops, no peripheral edema  Abdomen: soft and NT, no HSM,  BS normal  Musculoskeletal: No deformities, no cyanosis or  clubbing  Neuro: alert, non focal  Skin: Warm, no lesions or rashes       Assessment & Plan:   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  Hx of GERD and smoking as two ppt factors. No active disease seen on CT scan. Pt on Pirfenidone per [place] MD as it is available in [place], [place], [place] but not yet [place] ILD stable now Plan  Cont NAC Cont pirfenidone, titrate dose to 40mg /kg in three divided doses per day,  =s 800mg  tid Pt aware of adverse side effect issues LFTs normal 5/12,  Recheck in one month and repeat ov then

## 2011-05-25 NOTE — Patient Instructions (Signed)
Increase pirfenidone to two tabs three times a day for one week after meals. If ok after one week may then increase to three tabs three times a day for one week  Then increase to 4 tabs three times daily Return one month for labs and recheck No other medication changes Stay on N acetylcysteine

## 2011-05-26 NOTE — Assessment & Plan Note (Signed)
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  Hx of GERD and smoking as two ppt factors. No active disease seen on CT scan. Pt on Pirfenidone per [place] MD as it is available in [place], [place], [place] but not yet [place] ILD stable now Plan  Cont NAC Cont pirfenidone, titrate dose to 40mg /kg in three divided doses per day,  =s 800mg  tid Pt aware of adverse side effect issues LFTs normal 5/12,  Recheck in one month and repeat ov then

## 2011-06-17 ENCOUNTER — Ambulatory Visit (INDEPENDENT_AMBULATORY_CARE_PROVIDER_SITE_OTHER): Payer: Medicare Other | Admitting: Critical Care Medicine

## 2011-06-17 ENCOUNTER — Encounter: Payer: Self-pay | Admitting: Critical Care Medicine

## 2011-06-17 ENCOUNTER — Ambulatory Visit: Payer: Medicare Other

## 2011-06-17 VITALS — BP 120/80 | HR 77 | Temp 97.7°F | Ht 63.5 in | Wt 143.8 lb

## 2011-06-17 DIAGNOSIS — J84115 Respiratory bronchiolitis interstitial lung disease: Secondary | ICD-10-CM

## 2011-06-17 DIAGNOSIS — J849 Interstitial pulmonary disease, unspecified: Secondary | ICD-10-CM

## 2011-06-17 DIAGNOSIS — J8410 Pulmonary fibrosis, unspecified: Secondary | ICD-10-CM

## 2011-06-17 LAB — CBC WITH DIFFERENTIAL/PLATELET
Eosinophils Absolute: 0.1 10*3/uL (ref 0.0–0.7)
Eosinophils Relative: 1.5 % (ref 0.0–5.0)
MCV: 76.1 fl — ABNORMAL LOW (ref 78.0–100.0)
Monocytes Absolute: 0.5 10*3/uL (ref 0.1–1.0)
Neutrophils Relative %: 60.5 % (ref 43.0–77.0)
Platelets: 452 10*3/uL — ABNORMAL HIGH (ref 150.0–400.0)
WBC: 5 10*3/uL (ref 4.5–10.5)

## 2011-06-17 LAB — HEPATIC FUNCTION PANEL
ALT: 15 U/L (ref 0–35)
AST: 19 U/L (ref 0–37)
Albumin: 4.4 g/dL (ref 3.5–5.2)
Total Bilirubin: 0.6 mg/dL (ref 0.3–1.2)
Total Protein: 8.3 g/dL (ref 6.0–8.3)

## 2011-06-17 LAB — SEDIMENTATION RATE: Sed Rate: 23 mm/hr — ABNORMAL HIGH (ref 0–22)

## 2011-06-17 LAB — CK: Total CK: 62 U/L (ref 7–177)

## 2011-06-17 NOTE — Patient Instructions (Signed)
Labs today Full pulmonary function studies in September Continue to increase pirfenidone to goal Return 2-3 months depending on your schedule

## 2011-06-17 NOTE — Progress Notes (Signed)
Subjective:    Patient ID: Taylor Hall, female    DOB: 17-Nov-1945, 65 y.o.   MRN: [id number]  HPI   65 y.o.  WF with ILD uip/ipf  CT dx only, no tissue dx.  Now on pirfenidone via european MD Pt denies any significant sore throat, nasal congestion or excess secretions, fever, chills, sweats, unintended weight loss, pleurtic or exertional chest pain, orthopnea PND, or leg swelling Pt denies any increase in rescue therapy over baseline, denies waking up needing it or having any early am or nocturnal exacerbations of coughing/wheezing/or dyspnea. Pt also denies any obvious fluctuation in symptoms with  weather or environmental change or other alleviating or aggravating factors  8/22 Noted some nausea, does not stay.  Definite photosensitivity.  Not as much cough.   Up to 1800mg   Total daily and plan to go 2400mg  /d max dose   Past Medical History  Diagnosis Date  . Hypertension   . Hyperlipemia      Family History  Problem Relation Age of Onset  . Cancer Mother   . Diabetes Mother   . 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 COPD Mother   . Allergies Mother   . Allergies Brother   . Heart disease Father      History   Social History  . Marital Status: Married    Spouse Name: N/A    Number of Children: 2  . Years of Education: N/A   Occupational History  . Retired    Social History Main Topics  . Smoking status: Former Smoker -- 1.0 packs/day for 30 years    Types: Cigarettes    Quit date: 10/26/1980  . Smokeless tobacco: Never Used  . Alcohol Use: Yes     glass of wine  . Drug Use: No  . Sexually Active: Not on file     retired, college education, married, does not regularly exercise   Other Topics Concern  . Not on file   Social History Narrative  . No narrative on file     Allergies  Allergen Reactions  . Penicillins   . Simvastatin     REACTION: myalgias     Outpatient Prescriptions Prior to Visit  Medication Sig Dispense  Refill  . AMBULATORY NON FORMULARY MEDICATION Medication Name: Pirfenidone 200mg   Take as directed      . Calcium-Vitamin D 600-125 MG-UNIT TABS Take 1 tablet by mouth daily.        Marland Kitchen FLUoxetine (PROZAC) 20 MG capsule Take 20 mg by mouth daily.        Marland Kitchen gemfibrozil (LOPID) 600 MG tablet Take 600 mg by mouth 2 (two) times daily.        Marland Kitchen omeprazole (PRILOSEC) 20 MG capsule Take 1 capsule (20 mg total) by mouth daily.  30 capsule  6  . Acetylcysteine 600 MG CAPS Take 1 capsule (600 mg total) by mouth 3 (three) times daily.  90 capsule  0       Review of Systems  Constitutional:   No  weight loss, night sweats,  Fevers, chills, fatigue, lassitude. HEENT:   No headaches,  Difficulty swallowing,  Tooth/dental problems,  Sore throat,                No sneezing, itching, ear ache, nasal congestion, post nasal drip,   CV:  No chest pain,  Orthopnea, PND, swelling in lower extremities, anasarca, dizziness, palpitations  GI  No heartburn, indigestion,  abdominal pain, nausea, vomiting, diarrhea, change in bowel habits, loss of appetite  Resp: Notes  shortness of breath with exertion not  at rest.  No excess mucus, no productive cough,  No non-productive cough,  No coughing up of blood.  No change in color of mucus.  No wheezing.  No chest wall deformity  Skin: no rash or lesions.  GU: no dysuria, change in color of urine, no urgency or frequency.  No flank pain.  MS:  No joint pain or swelling.  No decreased range of motion.  No back pain.  Psych:  No change in mood or affect. No depression or anxiety.  No memory loss.     Objective:   Physical Exam   Filed Vitals:   06/17/11 1200  BP: 120/80  Pulse: 77  Temp: 97.7 F (36.5 C)  TempSrc: Oral  Height: 5' 3.5" (1.613 m)  Weight: 143 lb 12.8 oz (65.227 kg)  SpO2: 96%    Gen: Pleasant, well-nourished, in no distress,  normal affect  ENT: No lesions,  mouth clear,  oropharynx clear, no postnasal drip  Neck: No JVD, no TMG, no  carotid bruits  Lungs: No use of accessory muscles, no dullness to percussion, dry rales  Cardiovascular: RRR, heart sounds normal, no murmur or gallops, no peripheral edema  Abdomen: soft and NT, no HSM,  BS normal  Musculoskeletal: No deformities, no cyanosis or clubbing  Neuro: alert, non focal  Skin: Warm, no lesions or rashes       Assessment & Plan:   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  Hx of GERD and smoking as two ppt factors. No active disease seen on CT scan. 5/12: DLCO 52%, TLC 80%  Normal spirometry.  Emphysema on CT  Pt on Pirfenidone per European MD as it is available in [place], [place], [place] but not yet [place]   Improved on pirfenidone Plan Cont to ramp dose of pirfenidone up to 2400mg /day No other changes Note labs are normal

## 2011-06-18 NOTE — Assessment & Plan Note (Signed)
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  Hx of GERD and smoking as two ppt factors. No active disease seen on CT scan. 5/12: DLCO 52%, TLC 80%  Normal spirometry.  Emphysema on CT  Pt on Pirfenidone per European MD as it is available in [place], [place], [place] but not yet [place]   Improved on pirfenidone Plan Cont to ramp dose of pirfenidone up to 2400mg /day No other changes Note labs are normal

## 2011-06-22 LAB — HYPERSENSITIVITY PNUEMONITIS PROFILE

## 2011-07-21 ENCOUNTER — Ambulatory Visit (INDEPENDENT_AMBULATORY_CARE_PROVIDER_SITE_OTHER): Payer: Medicare Other | Admitting: Critical Care Medicine

## 2011-07-21 ENCOUNTER — Other Ambulatory Visit (INDEPENDENT_AMBULATORY_CARE_PROVIDER_SITE_OTHER): Payer: Medicare Other

## 2011-07-21 ENCOUNTER — Encounter: Payer: Self-pay | Admitting: Critical Care Medicine

## 2011-07-21 VITALS — BP 118/86 | HR 81 | Temp 97.7°F | Ht 63.0 in | Wt 146.0 lb

## 2011-07-21 DIAGNOSIS — J8410 Pulmonary fibrosis, unspecified: Secondary | ICD-10-CM

## 2011-07-21 DIAGNOSIS — J849 Interstitial pulmonary disease, unspecified: Secondary | ICD-10-CM

## 2011-07-21 DIAGNOSIS — R0600 Dyspnea, unspecified: Secondary | ICD-10-CM

## 2011-07-21 DIAGNOSIS — J84115 Respiratory bronchiolitis interstitial lung disease: Secondary | ICD-10-CM

## 2011-07-21 DIAGNOSIS — R0609 Other forms of dyspnea: Secondary | ICD-10-CM

## 2011-07-21 DIAGNOSIS — Z23 Encounter for immunization: Secondary | ICD-10-CM

## 2011-07-21 LAB — CBC WITH DIFFERENTIAL/PLATELET
Basophils Relative: 1.2 % (ref 0.0–3.0)
Eosinophils Absolute: 0.1 10*3/uL (ref 0.0–0.7)
Eosinophils Relative: 2.4 % (ref 0.0–5.0)
HCT: 36.5 % (ref 36.0–46.0)
Hemoglobin: 11.6 g/dL — ABNORMAL LOW (ref 12.0–15.0)
MCHC: 31.8 g/dL (ref 30.0–36.0)
MCV: 78.1 fl (ref 78.0–100.0)
Monocytes Absolute: 0.6 10*3/uL (ref 0.1–1.0)
Neutro Abs: 2.6 10*3/uL (ref 1.4–7.7)
RBC: 4.68 Mil/uL (ref 3.87–5.11)

## 2011-07-21 LAB — PULMONARY FUNCTION TEST

## 2011-07-21 LAB — HEPATIC FUNCTION PANEL
ALT: 13 U/L (ref 0–35)
Albumin: 4 g/dL (ref 3.5–5.2)
Total Protein: 7.8 g/dL (ref 6.0–8.3)

## 2011-07-21 NOTE — Progress Notes (Signed)
PFT done today. 

## 2011-07-21 NOTE — Patient Instructions (Signed)
Flu vaccine today Stay on Pirfenex 6 minute walk today Return 2 months

## 2011-07-21 NOTE — Progress Notes (Signed)
Subjective:    Patient ID: Taylor Hall, female    DOB: 1945-11-02, 65 y.o.   MRN: [id number]  HPI   65 y.o.  WF with ILD uip/ipf  CT dx only, no tissue dx.  Now on pirfenidone via european MD Pt denies any significant sore throat, nasal congestion or excess secretions, fever, chills, sweats, unintended weight loss, pleurtic or exertional chest pain, orthopnea PND, or leg swelling Pt denies any increase in rescue therapy over baseline, denies waking up needing it or having any early am or nocturnal exacerbations of coughing/wheezing/or dyspnea. Pt also denies any obvious fluctuation in symptoms with  weather or environmental change or other alleviating or aggravating factors  8/22 Noted some nausea, does not stay.  Definite photosensitivity.  Not as much cough.   Up to 1800mg   Total daily and plan to go 2400mg  /d max dose  07/22/2011 DLCO down to 8.0 42% Dyspnea is the same,  No real cough.  No real chest pain ex occ R side. Now up to 2400mg  of pirfenidone.  Now approved in [place].     Past Medical History  Diagnosis Date  . Hypertension   . Hyperlipemia      Family History  Problem Relation Age of Onset  . Cancer Mother   . Diabetes Mother   . 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 COPD Mother   . Allergies Mother   . Allergies Brother   . Heart disease Father      History   Social History  . Marital Status: Married    Spouse Name: N/A    Number of Children: 2  . Years of Education: N/A   Occupational History  . Retired    Social History Main Topics  . Smoking status: Former Smoker -- 1.0 packs/day for 30 years    Types: Cigarettes    Quit date: 10/26/1980  . Smokeless tobacco: Never Used  . Alcohol Use: Yes     glass of wine  . Drug Use: No  . Sexually Active: Not on file     retired, college education, married, does not regularly exercise   Other Topics Concern  . Not on file   Social History Narrative  . No narrative on file       Allergies  Allergen Reactions  . Penicillins   . Simvastatin     REACTION: myalgias     Outpatient Prescriptions Prior to Visit  Medication Sig Dispense Refill  . Acetylcysteine 600 MG CAPS Take 1 capsule by mouth 3 (three) times daily.       . AMBULATORY NON FORMULARY MEDICATION Medication Name: Pirfenidone 200mg   Take as directed      . Calcium-Vitamin D 600-125 MG-UNIT TABS Take 1 tablet by mouth daily.        Marland Kitchen FLUoxetine (PROZAC) 20 MG capsule Take 20 mg by mouth daily.        Marland Kitchen gemfibrozil (LOPID) 600 MG tablet Take 600 mg by mouth 2 (two) times daily.        Marland Kitchen omeprazole (PRILOSEC) 20 MG capsule Take 1 capsule (20 mg total) by mouth daily.  30 capsule  6       Review of Systems  Constitutional:   No  weight loss, night sweats,  Fevers, chills, fatigue, lassitude. HEENT:   No headaches,  Difficulty swallowing,  Tooth/dental problems,  Sore throat,  No sneezing, itching, ear ache, nasal congestion, post nasal drip,   CV:  No chest pain,  Orthopnea, PND, swelling in lower extremities, anasarca, dizziness, palpitations  GI  No heartburn, indigestion, abdominal pain, nausea, vomiting, diarrhea, change in bowel habits, loss of appetite  Resp: Notes  shortness of breath with exertion not  at rest.  No excess mucus, no productive cough,  No non-productive cough,  No coughing up of blood.  No change in color of mucus.  No wheezing.  No chest wall deformity  Skin: no rash or lesions.  GU: no dysuria, change in color of urine, no urgency or frequency.  No flank pain.  MS:  No joint pain or swelling.  No decreased range of motion.  No back pain.  Psych:  No change in mood or affect. No depression or anxiety.  No memory loss.     Objective:   Physical Exam   Filed Vitals:   07/21/11 1153  BP: 118/86  Pulse: 81  Temp: 97.7 F (36.5 C)  TempSrc: Oral  Height: 5\' 3"  (1.6 m)  Weight: 146 lb (66.225 kg)  SpO2: 97%    Gen: Pleasant, well-nourished,  in no distress,  normal affect  ENT: No lesions,  mouth clear,  oropharynx clear, no postnasal drip  Neck: No JVD, no TMG, no carotid bruits  Lungs: No use of accessory muscles, no dullness to percussion, dry rales  Cardiovascular: RRR, heart sounds normal, no murmur or gallops, no peripheral edema  Abdomen: soft and NT, no HSM,  BS normal  Musculoskeletal: No deformities, no cyanosis or clubbing  Neuro: alert, non focal  Skin: Warm, no lesions or rashes       Assessment & Plan:   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  Hx of GERD and smoking as two ppt factors. No active disease seen on CT scan. 5/12: DLCO 52%, TLC 80%  Normal spirometry. 07/21/11: FEV1 92% predicted FVC 80% predicted total lung capacity 79% predicted diffusion capacity 42% predicted   Emphysema on CT  Pt on Pirfenidone per European MD as it is available in [place], [place], [place] but not yet [place] The patient appears to be stable on Pirfenidone No adverse effects are seen  note labs are normal including normal liver function and CBC Plan Continue pirfenidone  No change in medication  follow up result of 6 minute walk

## 2011-07-22 ENCOUNTER — Telehealth: Payer: Self-pay | Admitting: Critical Care Medicine

## 2011-07-22 DIAGNOSIS — J849 Interstitial pulmonary disease, unspecified: Secondary | ICD-10-CM

## 2011-07-22 NOTE — Telephone Encounter (Signed)
Message copied by Valentino Hue on Wed Jul 22, 2011  2:37 PM ------      Message from: Shan Levans E      Created: Wed Jul 22, 2011  7:47 AM       Call the pt and tell her labs all ok

## 2011-07-22 NOTE — Telephone Encounter (Signed)
Called, spoke with pt.  She is aware labs are all ok per PW.  She verbalized understanding of this and would like results of SMW test done yesterday after OV.  Also, states she felt she did better on the PFT than what the results showed.  She is requesting to repeat this test.  Dr. Delford Field, pls advise.  Thanks!

## 2011-07-22 NOTE — Telephone Encounter (Signed)
Please have DLCO repeated only Tell her she did great on 6 MW  no desat

## 2011-07-22 NOTE — Progress Notes (Signed)
Note pt went 590m and no desat

## 2011-07-22 NOTE — Assessment & Plan Note (Signed)
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  Hx of GERD and smoking as two ppt factors. No active disease seen on CT scan. 5/12: DLCO 52%, TLC 80%  Normal spirometry. 07/21/11: FEV1 92% predicted FVC 80% predicted total lung capacity 79% predicted diffusion capacity 42% predicted   Emphysema on CT  Pt on Pirfenidone per European MD as it is available in [place], [place], [place] but not yet [place] The patient appears to be stable on Pirfenidone No adverse effects are seen  note labs are normal including normal liver function and CBC Plan Continue pirfenidone  No change in medication  follow up result of 6 minute walk

## 2011-07-23 ENCOUNTER — Encounter: Payer: Self-pay | Admitting: Critical Care Medicine

## 2011-07-27 ENCOUNTER — Telehealth: Payer: Self-pay | Admitting: Critical Care Medicine

## 2011-07-27 NOTE — Telephone Encounter (Signed)
LMOMTCB x 1 

## 2011-07-27 NOTE — Telephone Encounter (Signed)
Noted on flu shot reaction Also:  I had already requested , but order may not have been put in, for a repeat DLCO measuerment

## 2011-07-27 NOTE — Telephone Encounter (Signed)
I spoke with the pt and she called with 2 concerns: First she states she received the flu shot on 07-21-11 and then on 07-24-11 she broke out in a rash on her legs, arms, and chests. She states the rash is raised bumps, and that some of the bumps have puss in them. She states at first they looked like chicken pox and then now look like the measles. She states they are not painful but they do itch. She states she heard that there was a man in the hospital with the same reaction to the flu shot as well, so she believes this is the cause.  She states she has self medicated and the rash is improving. She does not want any medications, she just wanted PW to be aware.   The pt also states  she feels her PFT was not accurate. She states she feels like she needs to repeat this the end of October to compare because she feels better then the numbers say. Please advise. Carron Curie, CMA

## 2011-07-28 NOTE — Telephone Encounter (Signed)
Spoke with pt. I informed her she did great on 6 MW  - 580 meters no desat per PW.  I also advised PW recs she have the DLCO repeated only which was scheduled for tomorrow at 4:00 -- pt aware.  Order placed and will have appt linked.  Pt verbalized understanding of results and recs.  Nothing further needed at this time.

## 2011-07-28 NOTE — Telephone Encounter (Signed)
[email or web address]  

## 2011-07-28 NOTE — Telephone Encounter (Signed)
Spoke with pt and notified PW aware of her reaction to the flu shot

## 2011-07-29 ENCOUNTER — Ambulatory Visit (INDEPENDENT_AMBULATORY_CARE_PROVIDER_SITE_OTHER): Payer: Medicare Other | Admitting: Critical Care Medicine

## 2011-07-29 DIAGNOSIS — J849 Interstitial pulmonary disease, unspecified: Secondary | ICD-10-CM

## 2011-07-29 DIAGNOSIS — R0989 Other specified symptoms and signs involving the circulatory and respiratory systems: Secondary | ICD-10-CM

## 2011-07-29 LAB — PULMONARY FUNCTION TEST

## 2011-07-29 NOTE — Progress Notes (Signed)
DLCO done today.

## 2011-08-03 ENCOUNTER — Telehealth: Payer: Self-pay | Admitting: Critical Care Medicine

## 2011-08-03 DIAGNOSIS — J849 Interstitial pulmonary disease, unspecified: Secondary | ICD-10-CM

## 2011-08-03 NOTE — Telephone Encounter (Signed)
I called the pt and gave her by phone pft results

## 2011-08-04 ENCOUNTER — Encounter: Payer: Self-pay | Admitting: Critical Care Medicine

## 2011-08-11 ENCOUNTER — Telehealth: Payer: Self-pay | Admitting: Critical Care Medicine

## 2011-08-11 NOTE — Telephone Encounter (Signed)
Dry Cough, chest congestion with burning, PND, x 2 days. Denies f/c/s, n/v/d, body aches. Pt is aware that PW is out of the office until tomorrow PM and is okay with waiting for his response.  Allergies  Allergen Reactions  . Penicillins   . Simvastatin     REACTION: myalgias   Nicolette Bang, [place]

## 2011-08-12 MED ORDER — AZITHROMYCIN 250 MG PO TABS: ORAL_TABLET | ORAL | Status: AC

## 2011-08-12 NOTE — Telephone Encounter (Signed)
LMOM to let pt know we have called in abx and asked that she call us back to let us know she got the [facility]

## 2011-08-12 NOTE — Telephone Encounter (Signed)
Rx azithromycin 250mg  Take two once then one daily until gone #6

## 2011-08-12 NOTE — Telephone Encounter (Signed)
Pt called into confirm she rec'd Lori's message.  Antionette Fairy

## 2011-08-18 ENCOUNTER — Telehealth: Payer: Self-pay | Admitting: Critical Care Medicine

## 2011-08-18 DIAGNOSIS — J849 Interstitial pulmonary disease, unspecified: Secondary | ICD-10-CM

## 2011-08-18 NOTE — Telephone Encounter (Signed)
Called, spoke with pt.  I informed her PW is totally ok with her being referred to [facility] and states she would need to see Marcelene Butte with the IPF clinic at [facility].  She is aware I placed order for this, and she will receive another call regarding the appt.  She verbalized understanding of this and was very appreciative of this.

## 2011-08-18 NOTE — Telephone Encounter (Signed)
I spoke with pt and she states she would like to be referred to [facility] pulmonology. Pt states this is nothing against Dr. Delford Field she just wants to be proactive. Pt states she does not want Dr. Delford Field to take this the wrong way bc she loves him to death. Please advise Dr. Delford Field if okay to refer to [facility], thanks  Carver Fila, CMA

## 2011-08-18 NOTE — Telephone Encounter (Signed)
i am totally ok with this She would need to see Taylor Hall at [facility] IPF clinic

## 2011-09-16 ENCOUNTER — Encounter: Payer: Self-pay | Admitting: Critical Care Medicine

## 2011-09-16 ENCOUNTER — Other Ambulatory Visit (INDEPENDENT_AMBULATORY_CARE_PROVIDER_SITE_OTHER): Payer: Medicare Other

## 2011-09-16 ENCOUNTER — Ambulatory Visit (INDEPENDENT_AMBULATORY_CARE_PROVIDER_SITE_OTHER)
Admission: RE | Admit: 2011-09-16 | Discharge: 2011-09-16 | Disposition: A | Discharge status: Home / Self Care | Payer: Medicare Other | Source: Ambulatory Visit | Attending: Critical Care Medicine | Admitting: Critical Care Medicine

## 2011-09-16 ENCOUNTER — Ambulatory Visit (INDEPENDENT_AMBULATORY_CARE_PROVIDER_SITE_OTHER): Payer: Medicare Other | Admitting: Critical Care Medicine

## 2011-09-16 VITALS — BP 130/72 | HR 79 | Temp 98.0°F | Ht 63.0 in | Wt 145.0 lb

## 2011-09-16 DIAGNOSIS — J849 Interstitial pulmonary disease, unspecified: Secondary | ICD-10-CM

## 2011-09-16 DIAGNOSIS — J84115 Respiratory bronchiolitis interstitial lung disease: Secondary | ICD-10-CM

## 2011-09-16 DIAGNOSIS — M545 Low back pain: Secondary | ICD-10-CM | POA: Insufficient documentation

## 2011-09-16 DIAGNOSIS — M549 Dorsalgia, unspecified: Secondary | ICD-10-CM

## 2011-09-16 LAB — CBC WITH DIFFERENTIAL/PLATELET
Basophils Relative: 0.9 % (ref 0.0–3.0)
Eosinophils Absolute: 0.1 10*3/uL (ref 0.0–0.7)
Eosinophils Relative: 2.7 % (ref 0.0–5.0)
Hemoglobin: 11.9 g/dL — ABNORMAL LOW (ref 12.0–15.0)
Lymphocytes Relative: 26 % (ref 12.0–46.0)
MCHC: 33 g/dL (ref 30.0–36.0)
Neutro Abs: 2.5 10*3/uL (ref 1.4–7.7)
RBC: 4.52 Mil/uL (ref 3.87–5.11)
WBC: 4.4 10*3/uL — ABNORMAL LOW (ref 4.5–10.5)

## 2011-09-16 LAB — HEPATIC FUNCTION PANEL
ALT: 12 U/L (ref 0–35)
AST: 18 U/L (ref 0–37)
Albumin: 3.9 g/dL (ref 3.5–5.2)
Alkaline Phosphatase: 81 U/L (ref 39–117)
Total Protein: 7.4 g/dL (ref 6.0–8.3)

## 2011-09-16 NOTE — Assessment & Plan Note (Addendum)
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  Hx of GERD and smoking as two ppt factors. No active disease seen on CT scan. 5/12: DLCO 52%, TLC 80%  Normal spirometry. 07/21/11: FEV1 92% predicted FVC 80% predicted total lung capacity 79% predicted diffusion capacity 42% predicted 07/29/11 PFT repeat:  FeV1 98%  FVC 92%  DLCO 44%   Emphysema on CT  Pt on Pirfenidone per European MD as it is available in [place], [place], [place] but not yet [place] 9/26  528  No desat  03/30/11 501 m  No desat       Improved on pirfenidone with improved and stable DLCO Plan Cont pirfenidone Note labs ok today: [email or web address]

## 2011-09-16 NOTE — Assessment & Plan Note (Signed)
Lumbar disk disease L2-3  Severe  ?instability Plan Refer to Taylor Hall for evaluation

## 2011-09-16 NOTE — Progress Notes (Signed)
Addended by: Shan Levans E on: 09/16/2011 05:34 PM   Modules accepted: Orders

## 2011-09-16 NOTE — Progress Notes (Signed)
Subjective:    Patient ID: Taylor Hall, female    DOB: 12-29-1945, 65 y.o.   MRN: [id number]  HPI   65 y.o.  WF with ILD uip/ipf  CT dx only, no tissue dx.  Now on pirfenidone via european MD Pt denies any significant sore throat, nasal congestion or excess secretions, fever, chills, sweats, unintended weight loss, pleurtic or exertional chest pain, orthopnea PND, or leg swelling Pt denies any increase in rescue therapy over baseline, denies waking up needing it or having any early am or nocturnal exacerbations of coughing/wheezing/or dyspnea. Pt also denies any obvious fluctuation in symptoms with  weather or environmental change or other alleviating or aggravating factors  8/22 Noted some nausea, does not stay.  Definite photosensitivity.  Not as much cough.   Up to 1800mg   Total daily and plan to go 2400mg  /d max dose  9/25 DLCO down to 8.0 42% Dyspnea is the same,  No real cough.  No real chest pain ex occ R side. Now up to 2400mg  of pirfenidone.  Now approved in [place].    09/16/2011 Noted onset of back pain few days ago.  Now is constant. No prior back issues. No leg weakness.  No dysuria.  No urgency.  No change in dyspnea. Sl cough, no mucus.   Past Medical History  Diagnosis Date  . Hypertension   . Hyperlipemia      Family History  Problem Relation Age of Onset  . Cancer Mother   . Diabetes Mother   . 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 COPD Mother   . Allergies Mother   . Allergies Brother   . Heart disease Father      History   Social History  . Marital Status: Married    Spouse Name: N/A    Number of Children: 2  . Years of Education: N/A   Occupational History  . Retired    Social History Main Topics  . Smoking status: Former Smoker -- 1.0 packs/day for 30 years    Types: Cigarettes    Quit date: 10/26/1980  . Smokeless tobacco: Never Used  . Alcohol Use: Yes     glass of wine  . Drug Use: No  . Sexually Active: Not  on file     retired, college education, married, does not regularly exercise   Other Topics Concern  . Not on file   Social History Narrative  . No narrative on file     Allergies  Allergen Reactions  . Penicillins   . Simvastatin     REACTION: myalgias     Outpatient Prescriptions Prior to Visit  Medication Sig Dispense Refill  . Acetylcysteine 600 MG CAPS Take 1 capsule by mouth 3 (three) times daily.       . AMBULATORY NON FORMULARY MEDICATION Medication Name: Pirfenidone 200mg   Take as directed      . Calcium-Vitamin D 600-125 MG-UNIT TABS Take 1 tablet by mouth daily.        Marland Kitchen FLUoxetine (PROZAC) 20 MG capsule Take 20 mg by mouth daily.        Marland Kitchen gemfibrozil (LOPID) 600 MG tablet Take 600 mg by mouth 2 (two) times daily.        Marland Kitchen omeprazole (PRILOSEC) 20 MG capsule Take 1 capsule (20 mg total) by mouth daily.  30 capsule  6       Review of Systems  Constitutional:   No  weight  loss, night sweats,  Fevers, chills, fatigue, lassitude. HEENT:   No headaches,  Difficulty swallowing,  Tooth/dental problems,  Sore throat,                No sneezing, itching, ear ache, nasal congestion, post nasal drip,   CV:  No chest pain,  Orthopnea, PND, swelling in lower extremities, anasarca, dizziness, palpitations  GI  No heartburn, indigestion, abdominal pain, nausea, vomiting, diarrhea, change in bowel habits, loss of appetite  Resp: Notes  shortness of breath with exertion not  at rest.  No excess mucus, no productive cough,  No non-productive cough,  No coughing up of blood.  No change in color of mucus.  No wheezing.  No chest wall deformity  Skin: no rash or lesions.  GU: no dysuria, change in color of urine, no urgency or frequency.  No flank pain.  MS:  No joint pain or swelling.  No decreased range of motion.  No back pain.  Psych:  No change in mood or affect. No depression or anxiety.  No memory loss.     Objective:   Physical Exam   Filed Vitals:   09/16/11  1130  BP: 130/72  Pulse: 79  Temp: 98 F (36.7 C)  TempSrc: Oral  Height: 5\' 3"  (1.6 m)  Weight: 65.772 kg (145 lb)  SpO2: 98%    Gen: Pleasant, well-nourished, in no distress,  normal affect  ENT: No lesions,  mouth clear,  oropharynx clear, no postnasal drip  Neck: No JVD, no TMG, no carotid bruits  Lungs: No use of accessory muscles, no dullness to percussion, dry rales  Cardiovascular: RRR, heart sounds normal, no murmur or gallops, no peripheral edema  Abdomen: soft and NT, no HSM,  BS normal  Musculoskeletal: No deformities, no cyanosis or clubbing  Neuro: alert, non focal  Skin: Warm, no lesions or rashes   PFT Conversion 08/04/2011 07/23/2011  FVC 2.62 2.5  FVC PREDICT 2.83 2.83  FVC  % Predicted 92 88  FEV1 2 1.89  FEV1 PREDICT 2.05 2.05  FEV % Predicted 98 92  FEV1/FVC 76.3 75.6  FEV1/FVC PRE 73 73  FeF 25-75 1.68 1.48  FeF 25-75 % Predicted 71 62  Residual Volume (ml)  1.1  RV % EXPECT  61  DLCO (ml/mmHg sec) 8.3 8  DLCO % EXPEC 44 42  DLCO/VA 2.57 2.65  DLCO/VA%EXP 71 73  9/26  528  No desat  03/30/11 501 m  No desat    Assessment & Plan:   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  Hx of GERD and smoking as two ppt factors. No active disease seen on CT scan. 5/12: DLCO 52%, TLC 80%  Normal spirometry. 07/21/11: FEV1 92% predicted FVC 80% predicted total lung capacity 79% predicted diffusion capacity 42% predicted 07/29/11 PFT repeat:  FeV1 98%  FVC 92%  DLCO 44%   Emphysema on CT  Pt on Pirfenidone per European MD as it is available in [place], [place], [place] but not yet [place] 9/26  528  No desat  03/30/11 501 m  No desat       Improved on pirfenidone with improved and stable DLCO Plan Cont pirfenidone Note labs ok today: [email or web address]   Low back pain Lumbar disk disease L2-3  Severe  ?instability Plan Refer to [facility] for evaluation

## 2011-09-16 NOTE — Patient Instructions (Addendum)
No change in medications. Return in      3 months Labs today Xray of back today

## 2011-09-25 ENCOUNTER — Ambulatory Visit: Payer: Medicare Other | Admitting: Critical Care Medicine

## 2011-11-18 ENCOUNTER — Other Ambulatory Visit: Payer: Self-pay | Admitting: *Deleted

## 2011-11-18 MED ORDER — GEMFIBROZIL 600 MG PO TABS: 600.0000 mg | ORAL_TABLET | Freq: Two times a day (BID) | ORAL | Status: DC

## 2011-11-18 MED ORDER — FLUOXETINE HCL 20 MG PO CAPS: 20.0000 mg | ORAL_CAPSULE | Freq: Every day | ORAL | Status: DC

## 2012-01-14 HISTORY — PX: NISSEN FUNDOPLICATION: SHX2091

## 2012-01-17 HISTORY — PX: LUNG BIOPSY: SHX232

## 2012-01-21 ENCOUNTER — Encounter: Payer: Self-pay | Admitting: Family Medicine

## 2012-05-13 ENCOUNTER — Telehealth: Payer: Self-pay | Admitting: Critical Care Medicine

## 2012-05-13 NOTE — Telephone Encounter (Signed)
LMTCB-need to let patient know that PW has no openings through July but will be glad to send message to him to see if there is a date/time to work her in sooner. PW back in office on Monday 05-16-12 to advise then.

## 2012-05-13 NOTE — Telephone Encounter (Signed)
Pt called back and she is aware that we will check with PW about when to work the pt in.  Pt is aware that PW is out of the office until Monday and pt is ok to wait until Monday.   PW please advise. thanks

## 2012-05-15 NOTE — Telephone Encounter (Signed)
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  Alert her that this is so and she will likely be waiting on day of appt

## 2012-05-16 NOTE — Telephone Encounter (Signed)
Pt is leaving on Mon., 05/23/12 and says she is willing to be seen in the [facility] office on Thurs., 05/19/12/ She will be there for a 9am appt. I will forward to Crystal so she is aware.

## 2012-05-17 ENCOUNTER — Encounter: Payer: Self-pay | Admitting: Family Medicine

## 2012-05-17 ENCOUNTER — Ambulatory Visit (INDEPENDENT_AMBULATORY_CARE_PROVIDER_SITE_OTHER): Payer: Medicare Other | Admitting: Family Medicine

## 2012-05-17 VITALS — BP 128/76 | HR 66 | Ht 63.0 in | Wt 130.0 lb

## 2012-05-17 DIAGNOSIS — F419 Anxiety disorder, unspecified: Secondary | ICD-10-CM

## 2012-05-17 DIAGNOSIS — Z9889 Other specified postprocedural states: Secondary | ICD-10-CM

## 2012-05-17 DIAGNOSIS — Z23 Encounter for immunization: Secondary | ICD-10-CM

## 2012-05-17 DIAGNOSIS — F32A Depression, unspecified: Secondary | ICD-10-CM

## 2012-05-17 DIAGNOSIS — E785 Hyperlipidemia, unspecified: Secondary | ICD-10-CM

## 2012-05-17 DIAGNOSIS — J84112 Idiopathic pulmonary fibrosis: Secondary | ICD-10-CM

## 2012-05-17 DIAGNOSIS — K219 Gastro-esophageal reflux disease without esophagitis: Secondary | ICD-10-CM

## 2012-05-17 DIAGNOSIS — F341 Dysthymic disorder: Secondary | ICD-10-CM

## 2012-05-17 MED ORDER — FLUOXETINE HCL 20 MG PO CAPS: 20.0000 mg | ORAL_CAPSULE | Freq: Every day | ORAL | Status: DC

## 2012-05-17 NOTE — Patient Instructions (Addendum)
Ask Dr. Delford Field if he feels shingles vaccine is ok

## 2012-05-17 NOTE — Progress Notes (Signed)
  Subjective:    Patient ID: Taylor Hall, female    DOB: [birth year], 66 y.o.   MRN: [id number]  HPI Overall she feels she's doing well. She was originally evaluated by Dr. Shan Hall for her idiopathic pulmonary fibrosis. She was then referred to Dr. Doreatha Hall at [facility] for further evaluation. Since then she has had a Nissen fundoplication which she actually reports has helped her shortness of breath. In fact after her recovery her pulse ox is actually gone up. She is excited about this. She does have followup with Dr. Delford Hall on Thursday. She is still taking Pirfenidone 200mg .  She is primarily here to followup on her anxiety and depression today-overall she feels that she is doing well. Though, she does not want to stop it or adjust the medication today. Mrs. been very stressful year for her. She is sleeping well.      Review of Systems     Objective:   Physical Exam  Constitutional: She is oriented to person, place, and time. She appears well-developed and well-nourished.  HENT:  Head: Normocephalic and atraumatic.  Cardiovascular: Normal rate, regular rhythm and normal heart sounds.   Pulmonary/Chest: Effort normal and breath sounds normal.  Neurological: She is alert and oriented to person, place, and time.  Skin: Skin is warm and dry.  Psychiatric: She has a normal mood and affect. Her behavior is normal.          Assessment & Plan:  Anxiety depression-she's actually doing very well. We will continue her current regimen. I encouraged her to follow up in 6-12 months to make sure that she continues to do well and at some point we will need to try weaning her off the medication. For now I think it's a good idea to continue the medications as has been very stressful year and we can certainly wean it at any time.  GERD w/ Nissen fundoplication-she has been told to continue PPI for the rest of her life. She said she was well aware of the risks of increased fractures on the  medication. Thus I reminded her to make sure she is taking her calcium and vitamin D and that we get her bone density tests regularly.  Hyperlipidemia-due to recheck lipid levels. Lab slip given today. I encouraged her to get in the next week or 2 before she leaves the country for 3 months.  Tdap given today. I encouraged her to speak with Dr. Delford Hall about the shingles vaccine since it is a partially live virus to make sure that he feels it's safe.  Once she is back in the country in October she would like [place] to schedule her mammogram and DEXA scan at the same time. I asked her to call me when she gets back and I will place an order.  Note-she is not on any blood pressure medication and her blood pressure looks fantastic. I want to remove this from her problem list today.

## 2012-05-19 ENCOUNTER — Ambulatory Visit (INDEPENDENT_AMBULATORY_CARE_PROVIDER_SITE_OTHER): Payer: Medicare Other | Admitting: Critical Care Medicine

## 2012-05-19 ENCOUNTER — Other Ambulatory Visit: Payer: Self-pay | Admitting: *Deleted

## 2012-05-19 ENCOUNTER — Encounter: Payer: Self-pay | Admitting: Critical Care Medicine

## 2012-05-19 VITALS — BP 126/84 | HR 84 | Temp 97.6°F | Ht 64.0 in | Wt 130.0 lb

## 2012-05-19 DIAGNOSIS — J8410 Pulmonary fibrosis, unspecified: Secondary | ICD-10-CM

## 2012-05-19 DIAGNOSIS — J849 Interstitial pulmonary disease, unspecified: Secondary | ICD-10-CM

## 2012-05-19 DIAGNOSIS — K219 Gastro-esophageal reflux disease without esophagitis: Secondary | ICD-10-CM

## 2012-05-19 LAB — COMPLETE METABOLIC PANEL WITH GFR
ALT: 10 U/L (ref 0–35)
Albumin: 4.3 g/dL (ref 3.5–5.2)
CO2: 28 mEq/L (ref 19–32)
Chloride: 104 mEq/L (ref 96–112)
GFR, Est African American: >89 mL/min
GFR, Est Non African American: >89 mL/min
Glucose, Bld: 90 mg/dL (ref 70–99)
Potassium: 4.4 mEq/L (ref 3.5–5.3)
Sodium: 137 mEq/L (ref 135–145)
Total Protein: 7.1 g/dL (ref 6.0–8.3)

## 2012-05-19 LAB — CBC WITH DIFFERENTIAL/PLATELET
Hemoglobin: 13.3 g/dL (ref 12.0–15.0)
Lymphs Abs: 1.4 10*3/uL (ref 0.7–4.0)
Monocytes Relative: 12 % (ref 3–12)
Neutro Abs: 3.9 10*3/uL (ref 1.7–7.7)
Neutrophils Relative %: 62 % (ref 43–77)
RBC: 4.9 MIL/uL (ref 3.87–5.11)

## 2012-05-19 LAB — HEPATIC FUNCTION PANEL
AST: 15 U/L (ref 0–37)
Alkaline Phosphatase: 78 U/L (ref 39–117)
Bilirubin, Direct: 0.1 mg/dL (ref 0.0–0.3)
Indirect Bilirubin: 0.3 mg/dL (ref 0.0–0.9)
Total Bilirubin: 0.4 mg/dL (ref 0.3–1.2)

## 2012-05-19 LAB — LIPID PANEL
HDL: 69 mg/dL (ref >39)
LDL Cholesterol: 126 mg/dL — ABNORMAL HIGH (ref 0–99)

## 2012-05-19 MED ORDER — OMEPRAZOLE 20 MG PO CPDR: 20.0000 mg | DELAYED_RELEASE_CAPSULE | Freq: Every day | ORAL | Status: DC

## 2012-05-19 MED ORDER — CLARITHROMYCIN 500 MG PO TABS: 500.0000 mg | ORAL_TABLET | Freq: Two times a day (BID) | ORAL | Status: AC

## 2012-05-19 MED ORDER — GEMFIBROZIL 600 MG PO TABS: 600.0000 mg | ORAL_TABLET | Freq: Two times a day (BID) | ORAL | Status: DC

## 2012-05-19 NOTE — Progress Notes (Signed)
Subjective:    Patient ID: Taylor Hall, female    DOB: 18-Aug-1946, 66 y.o.   MRN: [id number]  HPI   66 y.o.  WF with ILD uip/ipf  OLBX 09/2011 At [facility]  Now on pirfenidone via european MD Pt denies any significant sore throat, nasal congestion or excess secretions, fever, chills, sweats, unintended weight loss, pleurtic or exertional chest pain, orthopnea PND, or leg swelling Pt denies any increase in rescue therapy over baseline, denies waking up needing it or having any early am or nocturnal exacerbations of coughing/wheezing/or dyspnea. Pt also denies any obvious fluctuation in symptoms with  weather or environmental change or other alleviating or aggravating factors  8/22 Noted some nausea, does not stay.  Definite photosensitivity.  Not as much cough.   Up to 1800mg   Total daily and plan to go 2400mg  /d max dose  9/25 DLCO down to 8.0 42% Dyspnea is the same,  No real cough.  No real chest pain ex occ R side. Now up to 2400mg  of pirfenidone.  Now approved in [place].    09/16/11 Noted onset of back pain few days ago.  Now is constant. No prior back issues. No leg weakness.  No dysuria.  No urgency.  No change in dyspnea. Sl cough, no mucus.  PF ?etiology, honeycomb  Lung findings on CT scan.  Hx of GERD and smoking as two ppt factors. No active disease seen on CT scan. 5/12: DLCO 52%, TLC 80%  Normal spirometry. 07/21/11: FEV1 92% predicted FVC 80% predicted total lung capacity 79% predicted diffusion capacity 42% predicted 07/29/11 PFT repeat:  FeV1 98%  FVC 92%  DLCO 44%   Emphysema on CT  Pt on Pirfenidone per European MD as it is available in [place], [place], [place] but not yet [place] 9/26  528  No desat  03/30/11 501 m  No desat       Improved on pirfenidone with improved and stable DLCO Plan Cont pirfenidone Note labs ok today: [email or web address]  05/19/2012 Not seen since 11/12. Has gone to [facility] with OLBxs  Showed UIP with mononuclear infiltrate Cough d/t GERD and rx  with Nissen fundoplication.   She  remains on pirfenidone. Pt has bouts of bronchitis.  At least twice this year. Pt gets this in [place] and [place]. Rx Abx each time last time three weeks ago.  Cough is dry on occasion, not the deep cough. Nissen has helped.  No heartburn.  Dyspnea is at baseline.   Past Medical History  Diagnosis Date  . Hypertension   . Hyperlipemia   . History of DVT (deep vein thrombosis) 1960    Right lower extremity  . GERD (gastroesophageal reflux disease)      Family History  Problem Relation Age of Onset  . Cancer Mother   . Diabetes Mother   . 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 COPD Mother   . Allergies Mother   . Allergies Brother   . Heart disease Father      History   Social History  . Marital Status: Married    Spouse Name: N/A    Number of Children: 2  . Years of Education: N/A   Occupational History  . Retired    Social History Main Topics  . Smoking status: Former Smoker -- 1.0 packs/day for 30 years    Types: Cigarettes    Quit date: 10/26/1980  . Smokeless tobacco: Never Used  .  Alcohol Use: Yes     glass of wine  . Drug Use: No  . Sexually Active: Not on file     retired, college education, married, does not regularly exercise   Other Topics Concern  . Not on file   Social History Narrative  . No narrative on file     Allergies  Allergen Reactions  . Penicillins   . Simvastatin     REACTION: myalgias     Outpatient Prescriptions Prior to Visit  Medication Sig Dispense Refill  . Acetylcysteine 600 MG CAPS Take 1 capsule by mouth 3 (three) times daily.       . AMBULATORY NON FORMULARY MEDICATION Medication Name: Pirfenidone 200mg   Take as directed      . aspirin 81 MG tablet Take 81 mg by mouth daily.      . Calcium-Vitamin D 600-125 MG-UNIT TABS Take 1 tablet by mouth daily.        Marland Kitchen FLUoxetine (PROZAC) 20 MG capsule Take 1 capsule (20 mg total) by mouth daily.  90 capsule  2  . gemfibrozil  (LOPID) 600 MG tablet Take 1 tablet (600 mg total) by mouth 2 (two) times daily.  180 tablet  0  . omeprazole (PRILOSEC) 20 MG capsule Take 1 capsule (20 mg total) by mouth daily.  30 capsule  6       Review of Systems  Constitutional:   No  weight loss, night sweats,  Fevers, chills, fatigue, lassitude. HEENT:   No headaches,  Difficulty swallowing,  Tooth/dental problems,  Sore throat,                No sneezing, itching, ear ache, nasal congestion, post nasal drip,   CV:  No chest pain,  Orthopnea, PND, swelling in lower extremities, anasarca, dizziness, palpitations  GI  No heartburn, indigestion, abdominal pain, nausea, vomiting, diarrhea, change in bowel habits, loss of appetite  Resp: Notes  shortness of breath with exertion not  at rest.  No excess mucus, no productive cough,  No non-productive cough,  No coughing up of blood.  No change in color of mucus.  No wheezing.  No chest wall deformity  Skin: no rash or lesions.  GU: no dysuria, change in color of urine, no urgency or frequency.  No flank pain.  MS:  No joint pain or swelling.  No decreased range of motion.  No back pain.  Psych:  No change in mood or affect. No depression or anxiety.  No memory loss.     Objective:   Physical Exam   Filed Vitals:   05/19/12 0841  BP: 126/84  Pulse: 84  Temp: 97.6 F (36.4 C)  TempSrc: Oral  Height: 5\' 4"  (1.626 m)  Weight: 130 lb (58.968 kg)  SpO2: 92%    Gen: Pleasant, well-nourished, in no distress,  normal affect  ENT: No lesions,  mouth clear,  oropharynx clear, no postnasal drip  Neck: No JVD, no TMG, no carotid bruits  Lungs: No use of accessory muscles, no dullness to percussion, dry rales  Cardiovascular: RRR, heart sounds normal, no murmur or gallops, no peripheral edema  Abdomen: soft and NT, no HSM,  BS normal  Musculoskeletal: No deformities, no cyanosis or clubbing  Neuro: alert, non focal  Skin: Warm, no lesions or rashes   PFT Conversion  08/04/2011 07/23/2011  FVC 2.62 2.5  FVC PREDICT 2.83 2.83  FVC  % Predicted 92 88  FEV1 2 1.89  FEV1 PREDICT 2.05 2.05  FEV % Predicted 98 92  FEV1/FVC 76.3 75.6  FEV1/FVC PRE 73 73  FeF 25-75 1.68 1.48  FeF 25-75 % Predicted 71 62  Residual Volume (ml)  1.1  RV % EXPECT  61  DLCO (ml/mmHg sec) 8.3 8  DLCO % EXPEC 44 42  DLCO/VA 2.57 2.65  DLCO/VA%EXP 71 73  9/26  528  No desat  03/30/11 501 m  No desat    Assessment & Plan:   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   Positive open lung biopsy December 2012  Hx of GERD and smoking as two ppt factors. No active disease seen on CT scan. 5/12: DLCO 52%, TLC 80%  Normal spirometry. 07/21/11: FEV1 92% predicted FVC 80% predicted total lung capacity 79% predicted diffusion capacity 42% predicted 07/29/11 PFT repeat:  FeV1 98%  FVC 92%  DLCO 44% 02/2012: PFT  FeV1 76%  FVC 78%  DLCO 46%   Emphysema on CT  Pt on Pirfenidone per European MD as it is available in [place], [place], [place] but not yet [place] 9/26  528  No desat  03/30/11 501 m  No desat    Patient returns after being under the care of [facility] for the past 9 months. 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 There was a question of aspiration from reflux. The patient underwent a Nissen fundoplication. Patient's cough is now improved since that time. The patient does have confirmed usual interstitial pneumonitis on open lung biopsy. The patient does return on pirfenidone Plan Maintain pirfenidone Maintain in acetylcysteine Return 4 months

## 2012-05-19 NOTE — Assessment & Plan Note (Signed)
IPF UIP with honeycomb  Lung findings on CT scan.   Positive open lung biopsy December 2012  Hx of GERD and smoking as two ppt factors. No active disease seen on CT scan. 5/12: DLCO 52%, TLC 80%  Normal spirometry. 07/21/11: FEV1 92% predicted FVC 80% predicted total lung capacity 79% predicted diffusion capacity 42% predicted 07/29/11 PFT repeat:  FeV1 98%  FVC 92%  DLCO 44% 02/2012: PFT  FeV1 76%  FVC 78%  DLCO 46%   Emphysema on CT  Pt on Pirfenidone per European MD as it is available in [place], [place], [place] but not yet [place] 9/26  528  No desat  03/30/11 501 m  No desat    Patient returns after being under the care of [facility] for the past 9 months. 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 There was a question of aspiration from reflux. The patient underwent a Nissen fundoplication. Patient's cough is now improved since that time. The patient does have confirmed usual interstitial pneumonitis on open lung biopsy. The patient does return on pirfenidone Plan Maintain pirfenidone Maintain in acetylcysteine Return 4 months

## 2012-05-19 NOTE — Patient Instructions (Signed)
No change in medications. Return in      4 months  Use Biaxin one twice a day for 7days as needed, call if you fill

## 2012-05-20 LAB — BASIC METABOLIC PANEL
BUN: 15 mg/dL (ref 6–23)
CO2: 27 mEq/L (ref 19–32)
Calcium: 9.4 mg/dL (ref 8.4–10.5)
Creat: 0.76 mg/dL (ref 0.50–1.10)
Glucose, Bld: 93 mg/dL (ref 70–99)

## 2012-05-20 NOTE — Progress Notes (Signed)
Quick Note:  [email or web address] ______ 

## 2012-05-20 NOTE — Progress Notes (Signed)
Quick Note:  Spoke with pt. I informed her labs are ok; she should stay on pirfenidone per Dr. Delford Field. She verbalized understanding of this and voiced no further questions/concerns at this time. ______

## 2012-07-25 ENCOUNTER — Encounter: Payer: Self-pay | Admitting: Family Medicine

## 2012-07-25 ENCOUNTER — Ambulatory Visit (INDEPENDENT_AMBULATORY_CARE_PROVIDER_SITE_OTHER): Payer: Medicare Other | Admitting: Family Medicine

## 2012-07-25 ENCOUNTER — Telehealth: Payer: Self-pay | Admitting: Critical Care Medicine

## 2012-07-25 VITALS — BP 144/94 | HR 100 | Wt 133.0 lb

## 2012-07-25 DIAGNOSIS — K5900 Constipation, unspecified: Secondary | ICD-10-CM

## 2012-07-25 DIAGNOSIS — R05 Cough: Secondary | ICD-10-CM

## 2012-07-25 DIAGNOSIS — K439 Ventral hernia without obstruction or gangrene: Secondary | ICD-10-CM

## 2012-07-25 NOTE — Patient Instructions (Addendum)
We will call you with the referral information.  Avoid any heavy lifting.

## 2012-07-25 NOTE — Telephone Encounter (Signed)
Start biaxin. Call with update on course

## 2012-07-25 NOTE — Telephone Encounter (Signed)
Called, spoke with pt.  States she took biaxin rx to [place] but lost this while riding a motorcycle.  She did not realize the biaxin rx sent in July still has refills left.  She will pick up biaxin rx today for prod cough with yellow mucus, chest tightness, sore throat, and fever off and on up to 100.  Symptoms started on Friday and are getting worse.  Denies wheezing and SOB.  She has a pending OV with PW on Sep 12, 2012 at 1:45 and will call back if symptoms worsen or do not improve with the abx.  Will route to PW so he is aware pt will start biaxin rx as directed.

## 2012-07-25 NOTE — Telephone Encounter (Signed)
Called, spoke with pt. Informed her of below per Dr. Delford Field. She verbalized understading.

## 2012-07-25 NOTE — Progress Notes (Signed)
  Subjective:    Patient ID: Taylor Hall, female    DOB: 26-Apr-1946, 66 y.o.   MRN: [id number]  HPI 4 days ago started with Cough and St.  Called Dr. Florene Route office and they call in an ABX for her.    Noticed a bulge over the summer.  Not coughing at that time.  It is sore at times.  Says sometimes feels more comfortable without her bra on.  Still constipated and has been using more senokot lately.  HAs been more problematic since hre hiatal hernia surgery in March.    Review of Systems     Objective:   Physical Exam  Constitutional: She is oriented to person, place, and time. She appears well-developed and well-nourished.  HENT:  Head: Normocephalic and atraumatic.  Right Ear: External ear normal.  Left Ear: External ear normal.  Nose: Nose normal.  Mouth/Throat: Oropharynx is clear and moist.       TMs and canals are clear.   Eyes: Conjunctivae normal and EOM are normal. Pupils are equal, round, and reactive to light.  Neck: Neck supple. No thyromegaly present.  Cardiovascular: Normal rate, regular rhythm and normal heart sounds.   Pulmonary/Chest: Effort normal and breath sounds normal. She has no wheezes.  Abdominal:       Her abdominal wall hernia is most prominent when she stands. It is approximately 4 x 5". Unable to palpate bowel. No distinct hole in the wall. Her old surgical scars are well-healed. She did have a hiatal hernia repair back in March of this year.  Lymphadenopathy:    She has no cervical adenopathy.  Neurological: She is alert and oriented to person, place, and time.  Skin: Skin is warm and dry.  Psychiatric: She has a normal mood and affect.          Assessment & Plan:  Cough- viral versus bacterial. She has been traveling which certainly puts her at risk. Her pulmonologist is very called in a prescription for her. Lung sounds are fairly clear with some slight crackles at the bases but this is not new. It isn't throat are normal. Call if not getting  better in one to 2 weeks.  Abdominal Wall hernia - Will refer to [facility] discussed risks and benefits of surgical repair and treatment. She is getting some soreness and tenderness over the area.  Constipation-recommend switch to MiraLax instead of using Senokot frequently. I warned about the risks of using a laxative daily. MiraLax as much safer long-term. Call if still having problems.

## 2012-07-25 NOTE — Telephone Encounter (Signed)
Lm w/ spouse for pt tcb  

## 2012-08-09 ENCOUNTER — Ambulatory Visit (INDEPENDENT_AMBULATORY_CARE_PROVIDER_SITE_OTHER): Payer: Medicare Other | Admitting: Surgery

## 2012-08-17 ENCOUNTER — Encounter (INDEPENDENT_AMBULATORY_CARE_PROVIDER_SITE_OTHER): Payer: Self-pay | Admitting: Surgery

## 2012-08-17 ENCOUNTER — Ambulatory Visit (INDEPENDENT_AMBULATORY_CARE_PROVIDER_SITE_OTHER): Payer: Medicare Other | Admitting: Surgery

## 2012-08-17 VITALS — BP 142/80 | HR 66 | Temp 97.7°F | Resp 12 | Ht 63.5 in | Wt 130.0 lb

## 2012-08-17 DIAGNOSIS — K432 Incisional hernia without obstruction or gangrene: Secondary | ICD-10-CM

## 2012-08-17 NOTE — Progress Notes (Signed)
Patient ID: Taylor Hall, female   DOB: 1946/01/08, 66 y.o.   MRN: [id number]  Chief Complaint  Patient presents with  . Other    possible abdominal wall hernia    HPI Taylor Hall is a 66 y.o. female.   HPI This is a very pleasant female referred by Dr. Linford Arnold for evaluation of abdominal wall hernia. She  had a Nissen performed at [facility] earlier this year laparoscopically.  A couple months ago she noticed a small bulge just above the umbilicus port site incision. This is now getting larger and causing some discomfort. She has mild pain but no obstructive symptoms. She is otherwise without complaints. Past Medical History  Diagnosis Date  . Hypertension   . Hyperlipemia   . History of DVT (deep vein thrombosis) 1960    Right lower extremity  . GERD (gastroesophageal reflux disease)   . Anemia   . Blood transfusion without reported diagnosis     Past Surgical History  Procedure Date  . Tubal ligation   . Appendectomy   . Varicose veins   . Rt rotator cuff     pain  . Cardiac catheterization   . Carpal tunnel release   . Tonsillectomy 1958  . Nissen fundoplication 01/14/2012    Dr. Alphonzo Dublin [facility]  . Lung biopsy 01/17/2012    Family History  Problem Relation Age of Onset  . Cancer Mother   . Diabetes Mother   . 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 COPD Mother   . Allergies Mother   . Allergies Brother   . Heart disease Father     Social History History  Substance Use Topics  . Smoking status: Former Smoker -- 1.0 packs/day for 30 years    Types: Cigarettes    Quit date: 10/26/1980  . Smokeless tobacco: Never Used  . Alcohol Use: Yes     glass of wine    Allergies  Allergen Reactions  . Penicillins   . Simvastatin     REACTION: myalgias    Current Outpatient Prescriptions  Medication Sig Dispense Refill  . Acetylcysteine 600 MG CAPS Take 1 capsule by mouth 3 (three) times daily.       . AMBULATORY NON FORMULARY  MEDICATION Medication Name: Pirfenidone 200mg   Take as directed      . aspirin 81 MG tablet Take 81 mg by mouth daily.      . Calcium-Vitamin D 600-125 MG-UNIT TABS Take 1 tablet by mouth daily.        Marland Kitchen FLUoxetine (PROZAC) 20 MG capsule Take 1 capsule (20 mg total) by mouth daily.  90 capsule  2  . gemfibrozil (LOPID) 600 MG tablet Take 1 tablet (600 mg total) by mouth 2 (two) times daily.  180 tablet  0  . omeprazole (PRILOSEC) 20 MG capsule Take 1 capsule (20 mg total) by mouth daily.  90 capsule  0    Review of Systems Review of Systems  Constitutional: Negative for fever, chills and unexpected weight change.  HENT: Negative for hearing loss, congestion, sore throat, trouble swallowing and voice change.   Eyes: Negative for visual disturbance.  Respiratory: Positive for cough. Negative for wheezing.   Cardiovascular: Negative for chest pain, palpitations and leg swelling.  Gastrointestinal: Positive for abdominal pain, constipation and abdominal distention. Negative for nausea, vomiting, diarrhea, blood in stool and anal bleeding.  Genitourinary: Negative for hematuria, vaginal bleeding and difficulty urinating.  Musculoskeletal: Positive for  arthralgias.  Skin: Negative for rash and wound.  Neurological: Negative for seizures, syncope and headaches.  Hematological: Negative for adenopathy. Does not bruise/bleed easily.  Psychiatric/Behavioral: Negative for confusion.    Blood pressure 142/80, pulse 66, temperature 97.7 F (36.5 C), temperature source Oral, resp. rate 12, height 5' 3.5" (1.613 m), weight 130 lb (58.968 kg).  Physical Exam Physical Exam  Constitutional: She is oriented to person, place, and time. She appears well-developed and well-nourished. No distress.  HENT:  Head: Normocephalic and atraumatic.  Right Ear: External ear normal.  Left Ear: External ear normal.  Nose: Nose normal.  Mouth/Throat: Oropharynx is clear and moist.  Eyes: Conjunctivae normal are  normal. Pupils are equal, round, and reactive to light. Right eye exhibits no discharge. Left eye exhibits no discharge. No scleral icterus.  Neck: Normal range of motion. Neck supple. No tracheal deviation present.  Cardiovascular: Normal rate, regular rhythm and intact distal pulses.   Murmur heard. Pulmonary/Chest: Breath sounds normal. No respiratory distress. She has no wheezes.  Abdominal: Soft. Bowel sounds are normal. She exhibits no distension. There is no tenderness. There is no rebound.       There is a reducible hernia just above the umbilicus underneath an old scar  Musculoskeletal: Normal range of motion. She exhibits no edema and no tenderness.  Lymphadenopathy:    She has no cervical adenopathy.  Neurological: She is alert and oriented to person, place, and time.  Skin: Skin is warm and dry. No rash noted. She is not diaphoretic. No erythema.  Psychiatric: Her behavior is normal. Judgment normal.    Data Reviewed   Assessment    Incisional hernia    Plan    As it is getting larger and more symptomatic, repair with mesh was recommended. I discussed both the laparoscopic and open techniques. As it was small, I would recommend open repair with mesh as an outpatient. I discussed the risks of surgery which includes but is not limited to bleeding, infection, injury to surrounding structures, recurrence, etc.  She understands and wishes to proceed. Likelihood of success is good       [name] A 08/17/2012, 2:00 PM

## 2012-08-29 ENCOUNTER — Ambulatory Visit (INDEPENDENT_AMBULATORY_CARE_PROVIDER_SITE_OTHER): Payer: Medicare Other | Admitting: Surgery

## 2012-09-02 DIAGNOSIS — F411 Generalized anxiety disorder: Secondary | ICD-10-CM | POA: Insufficient documentation

## 2012-09-02 DIAGNOSIS — Z789 Other specified health status: Secondary | ICD-10-CM | POA: Insufficient documentation

## 2012-09-02 DIAGNOSIS — Z9189 Other specified personal risk factors, not elsewhere classified: Secondary | ICD-10-CM | POA: Insufficient documentation

## 2012-09-02 DIAGNOSIS — Z780 Asymptomatic menopausal state: Secondary | ICD-10-CM | POA: Insufficient documentation

## 2012-09-02 DIAGNOSIS — M1990 Unspecified osteoarthritis, unspecified site: Secondary | ICD-10-CM | POA: Insufficient documentation

## 2012-09-02 DIAGNOSIS — Z9851 Tubal ligation status: Secondary | ICD-10-CM | POA: Insufficient documentation

## 2012-09-06 ENCOUNTER — Other Ambulatory Visit: Payer: Self-pay | Admitting: Family Medicine

## 2012-09-08 ENCOUNTER — Encounter (HOSPITAL_COMMUNITY): Admission: RE | Payer: Self-pay | Source: Ambulatory Visit

## 2012-09-08 ENCOUNTER — Ambulatory Visit (HOSPITAL_COMMUNITY): Admission: RE | Admit: 2012-09-08 | Payer: Medicare Other | Source: Ambulatory Visit | Admitting: Surgery

## 2012-09-08 SURGERY — REPAIR, HERNIA, INCISIONAL
Anesthesia: General

## 2012-09-12 ENCOUNTER — Ambulatory Visit: Payer: Medicare Other | Admitting: Critical Care Medicine

## 2012-09-14 HISTORY — PX: HERNIA REPAIR: SHX51

## 2012-09-20 ENCOUNTER — Encounter (INDEPENDENT_AMBULATORY_CARE_PROVIDER_SITE_OTHER): Payer: Medicare Other | Admitting: Surgery

## 2012-10-14 ENCOUNTER — Encounter: Payer: Self-pay | Admitting: Critical Care Medicine

## 2012-10-14 ENCOUNTER — Ambulatory Visit (INDEPENDENT_AMBULATORY_CARE_PROVIDER_SITE_OTHER): Payer: Medicare Other | Admitting: Critical Care Medicine

## 2012-10-14 ENCOUNTER — Encounter: Payer: Self-pay | Admitting: Physician Assistant

## 2012-10-14 ENCOUNTER — Ambulatory Visit (INDEPENDENT_AMBULATORY_CARE_PROVIDER_SITE_OTHER): Payer: Medicare Other | Admitting: Physician Assistant

## 2012-10-14 VITALS — BP 128/84 | HR 75 | Resp 14 | Ht 63.75 in | Wt 127.0 lb

## 2012-10-14 VITALS — BP 110/72 | HR 77 | Temp 98.1°F | Ht 63.0 in | Wt 130.0 lb

## 2012-10-14 DIAGNOSIS — E785 Hyperlipidemia, unspecified: Secondary | ICD-10-CM

## 2012-10-14 DIAGNOSIS — J8410 Pulmonary fibrosis, unspecified: Secondary | ICD-10-CM

## 2012-10-14 DIAGNOSIS — F341 Dysthymic disorder: Secondary | ICD-10-CM

## 2012-10-14 DIAGNOSIS — J069 Acute upper respiratory infection, unspecified: Secondary | ICD-10-CM

## 2012-10-14 DIAGNOSIS — J849 Interstitial pulmonary disease, unspecified: Secondary | ICD-10-CM

## 2012-10-14 DIAGNOSIS — K429 Umbilical hernia without obstruction or gangrene: Secondary | ICD-10-CM | POA: Insufficient documentation

## 2012-10-14 DIAGNOSIS — K219 Gastro-esophageal reflux disease without esophagitis: Secondary | ICD-10-CM | POA: Insufficient documentation

## 2012-10-14 MED ORDER — OMEPRAZOLE 20 MG PO CPDR: 20.0000 mg | DELAYED_RELEASE_CAPSULE | Freq: Every day | ORAL | Status: DC

## 2012-10-14 MED ORDER — AZITHROMYCIN 250 MG PO TABS: 250.0000 mg | ORAL_TABLET | Freq: Every day | ORAL | Status: DC

## 2012-10-14 MED ORDER — FLUOXETINE HCL 20 MG PO CAPS: 20.0000 mg | ORAL_CAPSULE | Freq: Every day | ORAL | Status: DC

## 2012-10-14 MED ORDER — GEMFIBROZIL 600 MG PO TABS: 600.0000 mg | ORAL_TABLET | Freq: Two times a day (BID) | ORAL | Status: DC

## 2012-10-14 MED ORDER — AZITHROMYCIN 250 MG PO TABS: ORAL_TABLET | ORAL | Status: DC

## 2012-10-14 MED ORDER — MOMETASONE FUROATE 50 MCG/ACT NA SUSP: 2.0000 | Freq: Every day | NASAL | Status: DC

## 2012-10-14 NOTE — Patient Instructions (Addendum)
Load up on vitamin C and zinc.   Upper Respiratory Infection, Adult An upper respiratory infection (URI) is also known as the common cold. It is often caused by a type of germ (virus). Colds are easily spread (contagious). You can pass it to others by kissing, coughing, sneezing, or drinking out of the same glass. Usually, you get better in 1 or 2 weeks.  HOME CARE   Only take medicine as told by your doctor.  Use a warm mist humidifier or breathe in steam from a hot shower.  Drink enough water and fluids to keep your pee (urine) clear or pale yellow.  Get plenty of rest.  Return to work when your temperature is back to normal or as told by your doctor. You may use a face mask and wash your hands to stop your cold from spreading. GET HELP RIGHT AWAY IF:   After the first few days, you feel you are getting worse.  You have questions about your medicine.  You have chills, shortness of breath, or brown or red spit (mucus).  You have yellow or brown snot (nasal discharge) or pain in the face, especially when you bend forward.  You have a fever, puffy (swollen) neck, pain when you swallow, or white spots in the back of your throat.  You have a bad headache, ear pain, sinus pain, or chest pain.  You have a high-pitched whistling sound when you breathe in and out (wheezing).  You have a lasting cough or cough up blood.  You have sore muscles or a stiff neck. MAKE SURE YOU:   Understand these instructions.  Will watch your condition.  Will get help right away if you are not doing well or get worse. Document Released: 03/30/2008 Document Revised: 01/04/2012 Document Reviewed: 02/16/2011 [facility] Patient Information 2013 [place], [place].

## 2012-10-14 NOTE — Assessment & Plan Note (Signed)
IPF NSIP Recent DLCO improved to 51% at [facility] Plan Cont pirfenidone Cont NAC GERD Rx

## 2012-10-14 NOTE — Progress Notes (Signed)
Subjective:    Patient ID: Taylor Hall, female    DOB: 01/23/1946, 66 y.o.   MRN: [id number]  HPI   66 y.o.  WF with ILD uip/ipf  OLBX 09/2011 At [facility]  Now on pirfenidone via european MD Pt denies any significant sore throat, nasal congestion or excess secretions, fever, chills, sweats, unintended weight loss, pleurtic or exertional chest pain, orthopnea PND, or leg swelling Pt denies any increase in rescue therapy over baseline, denies waking up needing it or having any early am or nocturnal exacerbations of coughing/wheezing/or dyspnea. Pt also denies any obvious fluctuation in symptoms with  weather or environmental change or other alleviating or aggravating factors  8/22 Noted some nausea, does not stay.  Definite photosensitivity.  Not as much cough.   Up to 1800mg   Total daily and plan to go 2400mg  /d max dose  9/25 DLCO down to 8.0 42% Dyspnea is the same,  No real cough.  No real chest pain ex occ R side. Now up to 2400mg  of pirfenidone.  Now approved in [place].    09/16/11 Noted onset of back pain few days ago.  Now is constant. No prior back issues. No leg weakness.  No dysuria.  No urgency.  No change in dyspnea. Sl cough, no mucus.  PF ?etiology, [facility]  Lung findings on CT scan.  Hx of GERD and smoking as two ppt factors. No active disease seen on CT scan. 5/12: DLCO 52%, TLC 80%  Normal spirometry. 07/21/11: FEV1 92% predicted FVC 80% predicted total lung capacity 79% predicted diffusion capacity 42% predicted 07/29/11 PFT repeat:  FeV1 98%  FVC 92%  DLCO 44%   Emphysema on CT  Pt on Pirfenidone per European MD as it is available in [place], [place], [place] but not yet [place] 9/26  528  No desat  03/30/11 501 m  No desat       Improved on pirfenidone with improved and stable DLCO Plan Cont pirfenidone Note labs ok today: [email or web address]  05/19/2012 Not seen since 11/12. Has gone to [facility] with OLBxs  Showed UIP with mononuclear infiltrate Cough d/t GERD and rx  with Nissen fundoplication.   She  remains on pirfenidone. Pt has bouts of bronchitis.  At least twice this year. Pt gets this in [place] and [place]. Rx Abx each time last time three weeks ago.  Cough is dry on occasion, not the deep cough. Nissen has helped.  No heartburn.  Dyspnea is at baseline.  10/14/2012 URI and febrile (98.1)  Notes dry cough and tickle. Sinus pressure and pndrip.  No change in dyspnea. No chest pain.  No heartburn.  S/p nissen. Had ventral hernia 09/14/12.  Pt to restart pirfenidone 12/23 Pt denies any significant sore throat, nasal congestion or excess secretions, fever, chills, sweats, unintended weight loss, pleurtic or exertional chest pain, orthopnea PND, or leg swelling Pt denies any increase in rescue therapy over baseline, denies waking up needing it or having any early am or nocturnal exacerbations of coughing/wheezing/or dyspnea. Pt also denies any obvious fluctuation in symptoms with  weather or environmental change or other alleviating or aggravating factors    Past Medical History  Diagnosis Date  . Hypertension   . Hyperlipemia   . History of DVT (deep vein thrombosis) 1960    Right lower extremity  . GERD (gastroesophageal reflux disease)   . Anemia   . Blood transfusion without reported diagnosis      Family History  Problem Relation Age  of Onset  . Cancer Mother   . Diabetes Mother   . 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 COPD Mother   . Allergies Mother   . Allergies Brother   . Heart disease Father      History   Social History  . Marital Status: Married    Spouse Name: N/A    Number of Children: 2  . Years of Education: N/A   Occupational History  . Retired    Social History Main Topics  . Smoking status: Former Smoker -- 1.0 packs/day for 30 years    Types: Cigarettes    Quit date: 10/26/1980  . Smokeless tobacco: Never Used  . Alcohol Use: Yes     Comment: glass of wine  . Drug Use: No  . Sexually  Active: Not on file     Comment: retired, college education, married, does not regularly exercise   Other Topics Concern  . Not on file   Social History Narrative  . No narrative on file     Allergies  Allergen Reactions  . Penicillins   . Simvastatin     REACTION: myalgias     Outpatient Prescriptions Prior to Visit  Medication Sig Dispense Refill  . Acetylcysteine 600 MG CAPS Take 1 capsule by mouth 3 (three) times daily.       Marland Kitchen aspirin 81 MG tablet Take 81 mg by mouth daily.      . Calcium-Vitamin D 600-125 MG-UNIT TABS Take 1 tablet by mouth daily.        . AMBULATORY NON FORMULARY MEDICATION Medication Name: Pirfenidone 200mg   Take as directed      Last reviewed on 10/14/2012  1:50 PM by Storm Frisk, MD     Review of Systems  Constitutional:   No  weight loss, night sweats,  Fevers, chills, fatigue, lassitude. HEENT:   No headaches,  Difficulty swallowing,  Tooth/dental problems,  Sore throat,                No sneezing, itching, ear ache, nasal congestion, post nasal drip,   CV:  No chest pain,  Orthopnea, PND, swelling in lower extremities, anasarca, dizziness, palpitations  GI  No heartburn, indigestion, abdominal pain, nausea, vomiting, diarrhea, change in bowel habits, loss of appetite  Resp: Notes  shortness of breath with exertion not  at rest.  No excess mucus, no productive cough,  No non-productive cough,  No coughing up of blood.  No change in color of mucus.  No wheezing.  No chest wall deformity  Skin: no rash or lesions.  GU: no dysuria, change in color of urine, no urgency or frequency.  No flank pain.  MS:  No joint pain or swelling.  No decreased range of motion.  No back pain.  Psych:  No change in mood or affect. No depression or anxiety.  No memory loss.     Objective:   Physical Exam   Filed Vitals:   10/14/12 1330  BP: 110/72  Pulse: 77  Temp: 98.1 F (36.7 C)  TempSrc: Oral  Height: 5\' 3"  (1.6 m)  Weight: 130 lb (58.968  kg)  SpO2: 94%    Gen: Pleasant, well-nourished, in no distress,  normal affect  ENT: No lesions,  mouth clear,  oropharynx clear, no postnasal drip  Neck: No JVD, no TMG, no carotid bruits  Lungs: No use of accessory muscles, no dullness to percussion, dry rales  Cardiovascular: RRR, heart sounds normal, no murmur or gallops, no peripheral edema  Abdomen: soft and NT, no HSM,  BS normal  Musculoskeletal: No deformities, no cyanosis or clubbing  Neuro: alert, non focal  Skin: Warm, no lesions or rashes   PFT Conversion 08/04/2011 07/23/2011  FVC 2.62 2.5  FVC PREDICT 2.83 2.83  FVC  % Predicted 92 88  FEV1 2 1.89  FEV1 PREDICT 2.05 2.05  FEV % Predicted 98 92  FEV1/FVC 76.3 75.6  FEV1/FVC PRE 73 73  FeF 25-75 1.68 1.48  FeF 25-75 % Predicted 71 62  Residual Volume (ml)  1.1  RV % EXPECT  61  DLCO (ml/mmHg sec) 8.3 8  DLCO % EXPEC 44 42  DLCO/VA 2.57 2.65  DLCO/VA%EXP 71 73  9/26  528  No desat  03/30/11 501 m  No desat 10/13 530m no desat DLCO 51% 10/13    Assessment & Plan:   Interstitial lung disease IPF NSIP Recent DLCO improved to 51% at [facility] Plan Cont pirfenidone Cont NAC GERD Rx

## 2012-10-14 NOTE — Progress Notes (Signed)
  Subjective:    Patient ID: Hideko Esselman, female    DOB: 29-Oct-1945, 66 y.o.   MRN: [id number]  HPI Patient is a very pleasant 66 year old female who presents to the clinic for med refills. She is patient of Dr. Jene Every and needed to be seen before the Christmas holiday. She is going to be out of the country on a boat for 6 months and needs prescriptions for that amount of time. She is doing very well today and has no complaints regarding medications that are ongoing. She reports that her depression is very well controlled on Prozac. Her acid reflux is controlled on omeprazole. Her LDL was elevated at last office visit she continues to take Lopid twice a day.  She has had some sinus congestion and pressure over the last couple of days. Patient does have interstitial lung disease and is prone to viral infection starting and bacterial infections. She denies any fever or chills. She has had a productive cough that started. She has started to feel some chest tightness but no shortness of breath or pain. She's tried a lot of over-the-counter preparations and has bloated up on vitamin C and zinc.  Review of Systems     Objective:   Physical Exam  Constitutional: She is oriented to person, place, and time. She appears well-developed and well-nourished.  HENT:  Head: Normocephalic and atraumatic.  Right Ear: External ear normal.  Left Ear: External ear normal.  Nose: Nose normal.  Mouth/Throat: Oropharynx is clear and moist.       TMs clear bilaterally.  Mom maxillary sinus pressure to palpation. No frontal tenderness.  Eyes: Conjunctivae normal are normal.  Neck: Normal range of motion. Neck supple.  Cardiovascular: Normal rate, regular rhythm and normal heart sounds.   Pulmonary/Chest: Effort normal and breath sounds normal. She has no wheezes.  Lymphadenopathy:    She has no cervical adenopathy.  Neurological: She is alert and oriented to person, place, and time.  Skin: Skin is warm and  dry.  Psychiatric: She has a normal mood and affect. Her behavior is normal.          Assessment & Plan:  Depression-Prozac refill today for 6 months.  GERD-omeprazole refilled for 6 months.  Hyperlipidemia-Lopid was refilled for 6 months. Did get patient lab slip to have fasting cholesterol rechecked since last LDL was elevated. Will call patient with results to make medication changes as needed. Patient encouraged to keep a low saturated fat diet.   URI-discuss that I think at this point infection is viral in origin. I suggest treating with symptomatic care. Continue with vitamin C and zinc. I am aware that she is more prone to lung infections. Patient is going to be out of town next week and for the next 6 months. I did go ahead and give her a prescription for Z-Pak. I discussed with her not to take this unless things worsen or she spiked a fever. Patient is aware. Encouraged patient to try Mucinex.

## 2012-10-14 NOTE — Patient Instructions (Signed)
Azithromycin 250mg  Take two once then one daily until gone Use nasonex daily two puff ea nostril Sinus rinse No other med changes Return 4 months

## 2012-10-17 LAB — LIPID PANEL
Cholesterol: 154 mg/dL (ref 0–200)
Total CHOL/HDL Ratio: 2.5 Ratio
Triglycerides: 68 mg/dL (ref <150)
VLDL: 14 mg/dL (ref 0–40)

## 2013-01-18 ENCOUNTER — Telehealth: Payer: Self-pay | Admitting: Critical Care Medicine

## 2013-01-18 NOTE — Telephone Encounter (Signed)
Called pt on 01/18/13 to make next ov per recall.  Pt's phone has been disconnected.  Mailed recall letter to home address. Leanora Ivanoff

## 2013-01-30 ENCOUNTER — Telehealth: Payer: Self-pay | Admitting: Critical Care Medicine

## 2013-01-30 NOTE — Telephone Encounter (Signed)
Mailed recall letter after trying to call pt 3 x's to make next ov per recall.  Wrong address.  Recall letter sent back to our office 01/30/13. Taylor Hall

## 2013-03-27 ENCOUNTER — Ambulatory Visit (INDEPENDENT_AMBULATORY_CARE_PROVIDER_SITE_OTHER): Payer: Medicare Other | Admitting: Critical Care Medicine

## 2013-03-27 ENCOUNTER — Ambulatory Visit (INDEPENDENT_AMBULATORY_CARE_PROVIDER_SITE_OTHER)
Admission: RE | Admit: 2013-03-27 | Discharge: 2013-03-27 | Disposition: A | Discharge status: Home / Self Care | Payer: Medicare Other | Source: Ambulatory Visit | Attending: Critical Care Medicine | Admitting: Critical Care Medicine

## 2013-03-27 ENCOUNTER — Encounter: Payer: Self-pay | Admitting: Critical Care Medicine

## 2013-03-27 VITALS — BP 122/80 | HR 70 | Temp 97.1°F | Ht 63.5 in | Wt 127.0 lb

## 2013-03-27 DIAGNOSIS — J8410 Pulmonary fibrosis, unspecified: Secondary | ICD-10-CM

## 2013-03-27 DIAGNOSIS — J849 Interstitial pulmonary disease, unspecified: Secondary | ICD-10-CM

## 2013-03-27 MED ORDER — CLARITHROMYCIN 500 MG PO TABS: 500.0000 mg | ORAL_TABLET | Freq: Two times a day (BID) | ORAL | Status: DC

## 2013-03-27 NOTE — Progress Notes (Signed)
Subjective:    Patient ID: Taylor Hall, female    DOB: Mar 31, 1946, 67 y.o.   MRN: [id number]  HPI   67 y.o.  WF with ILD uip/ipf  OLBX 09/2011 At [facility]  03/27/2013 Pt notes more dyspnea after back from [place] trip Pt more dyspneic with minimal exertion.  Taking pirfenidone 2400mg  /d.   No real mucus.  Cough is dry. [facility] wanted another 6mw at [facility] 5/29: 59m 83%.    Past Medical History  Diagnosis Date  . Hypertension   . Hyperlipemia   . History of DVT (deep vein thrombosis) 1960    Right lower extremity  . GERD (gastroesophageal reflux disease)   . Anemia   . Blood transfusion without reported diagnosis      Family History  Problem Relation Age of Onset  . Cancer Mother   . Diabetes Mother   . 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 COPD Mother   . Allergies Mother   . Allergies Brother   . Heart disease Father      History   Social History  . Marital Status: Married    Spouse Name: N/A    Number of Children: 2  . Years of Education: N/A   Occupational History  . Retired    Social History Main Topics  . Smoking status: Former Smoker -- 1.00 packs/day for 30 years    Types: Cigarettes    Quit date: 10/26/1980  . Smokeless tobacco: Never Used  . Alcohol Use: Yes     Comment: glass of wine  . Drug Use: No  . Sexually Active: Not on file     Comment: retired, college education, married, does not regularly exercise   Other Topics Concern  . Not on file   Social History Narrative  . No narrative on file     Allergies  Allergen Reactions  . Penicillins   . Simvastatin     REACTION: myalgias     Outpatient Prescriptions Prior to Visit  Medication Sig Dispense Refill  . Acetylcysteine 600 MG CAPS Take 1 capsule by mouth 3 (three) times daily.       . AMBULATORY NON FORMULARY MEDICATION Medication Name: Pirfenidone 200mg   Take as directed      . aspirin 81 MG tablet Take 81 mg by mouth daily.      . Calcium-Vitamin D  600-125 MG-UNIT TABS Take 1 tablet by mouth daily.        Marland Kitchen FLUoxetine (PROZAC) 20 MG capsule Take 1 capsule (20 mg total) by mouth daily.  180 capsule  0  . gemfibrozil (LOPID) 600 MG tablet Take 1 tablet (600 mg total) by mouth 2 (two) times daily.  360 tablet  0  . omeprazole (PRILOSEC) 20 MG capsule Take 1 capsule (20 mg total) by mouth daily.  180 capsule  0  . mometasone (NASONEX) 50 MCG/ACT nasal spray Place 2 sprays into the nose daily.  17 g  6  . azithromycin (ZITHROMAX) 250 MG tablet Take 1 tablet (250 mg total) by mouth daily. Take two once then one daily until gone  6 each  0   No facility-administered medications prior to visit.       Review of Systems  Constitutional:   No  weight loss, night sweats,  Fevers, chills, fatigue, lassitude. HEENT:   No headaches,  Difficulty swallowing,  Tooth/dental problems,  Sore throat,  No sneezing, itching, ear ache, nasal congestion, post nasal drip,   CV:  No chest pain,  Orthopnea, PND, swelling in lower extremities, anasarca, dizziness, palpitations  GI  No heartburn, indigestion, abdominal pain, nausea, vomiting, diarrhea, change in bowel habits, loss of appetite  Resp: Notes  shortness of breath with exertion not  at rest.  No excess mucus, no productive cough,  No non-productive cough,  No coughing up of blood.  No change in color of mucus.  No wheezing.  No chest wall deformity  Skin: no rash or lesions.  GU: no dysuria, change in color of urine, no urgency or frequency.  No flank pain.  MS:  No joint pain or swelling.  No decreased range of motion.  No back pain.  Psych:  No change in mood or affect. No depression or anxiety.  No memory loss.     Objective:   Physical Exam   Filed Vitals:   03/27/13 1345  BP: 122/80  Pulse: 70  Temp: 97.1 F (36.2 C)  TempSrc: Oral  Height: 5' 3.5" (1.613 m)  Weight: 127 lb (57.607 kg)  SpO2: 94%    Gen: Pleasant, well-nourished, in no distress,  normal  affect  ENT: No lesions,  mouth clear,  oropharynx clear, no postnasal drip  Neck: No JVD, no TMG, no carotid bruits  Lungs: No use of accessory muscles, no dullness to percussion, dry rales  Cardiovascular: RRR, heart sounds normal, no murmur or gallops, no peripheral edema  Abdomen: soft and NT, no HSM,  BS normal  Musculoskeletal: No deformities, no cyanosis or clubbing  Neuro: alert, non focal  Skin: Warm, no lesions or rashes  Pulmonary function testing today is back to prior baseline. Specifically, the FVC is 2.52 L (%; formerly 2.52, 2.33), FEV1 is 1.84 L (%; formerly 1.84, 1.75), and ratio is 0.73. DLCO is 9.3 (51%; formerly 9.3, 8.4). Pulmonary Function Test (PFT) 08/18/2012 03/23/2013  FVC PRE 2.52 2.25  FVC % PRE PRED 84 76  FEV1 PRE 1.84 1.73  FEV1 % PRE PRED 79 76  FEV1/FVC PRE 73 77  FEF25-75% PRE 1.21 1.39  RV PRED 2.12 2.14  DLCO % PRE PRED 51 46   6 minute walk test: 83% nadir on RA, 1788 ft (545 m). Prior walk: 559.6 meters (114% predicted), Start: HR 80, SpO2 98% RA. Max Exercise: HR 135 (88% predicted), Spo2 91% RA      Assessment & Plan:   Interstitial lung disease Usual interstitial pneumonitis with now evidence for desaturation on recent 6 minute walk Plan Maintain pirfenidone as prescribed Repeat 6 minute walk Obtain chest x-ray

## 2013-03-27 NOTE — Patient Instructions (Signed)
Get your mammogram and a bone density study done Get a 6 minute walk No change in medications We will have you screened for expanded access program for pirfenidone Return 3 months

## 2013-03-27 NOTE — Assessment & Plan Note (Signed)
Usual interstitial pneumonitis with now evidence for desaturation on recent 6 minute walk Plan Maintain pirfenidone as prescribed Repeat 6 minute walk Obtain chest x-ray

## 2013-03-28 NOTE — Progress Notes (Signed)
Quick Note:  Notify the patient that the Xray is stable and no progression in fibrosis on the CXR No change in medications are recommended. Continue current meds as prescribed at last office visit ______

## 2013-03-29 NOTE — Progress Notes (Signed)
Quick Note:  Called, spoke with pt. Informed her of cxr results and recs per Dr. Wright. She verbalized understanding and voiced no further questions or concerns at this time. ______ 

## 2013-04-07 ENCOUNTER — Other Ambulatory Visit (HOSPITAL_COMMUNITY)
Admission: RE | Admit: 2013-04-07 | Discharge: 2013-04-07 | Disposition: A | Discharge status: Home / Self Care | Payer: Medicare Other | Source: Ambulatory Visit | Attending: Family Medicine | Admitting: Family Medicine

## 2013-04-07 ENCOUNTER — Ambulatory Visit (INDEPENDENT_AMBULATORY_CARE_PROVIDER_SITE_OTHER): Payer: Medicare Other | Admitting: Family Medicine

## 2013-04-07 ENCOUNTER — Encounter: Payer: Self-pay | Admitting: Family Medicine

## 2013-04-07 VITALS — BP 126/72 | HR 97 | Ht 63.75 in | Wt 125.0 lb

## 2013-04-07 DIAGNOSIS — E785 Hyperlipidemia, unspecified: Secondary | ICD-10-CM

## 2013-04-07 DIAGNOSIS — R87810 Cervical high risk human papillomavirus (HPV) DNA test positive: Secondary | ICD-10-CM | POA: Insufficient documentation

## 2013-04-07 DIAGNOSIS — Z124 Encounter for screening for malignant neoplasm of cervix: Secondary | ICD-10-CM | POA: Insufficient documentation

## 2013-04-07 DIAGNOSIS — Z1231 Encounter for screening mammogram for malignant neoplasm of breast: Secondary | ICD-10-CM

## 2013-04-07 DIAGNOSIS — N951 Menopausal and female climacteric states: Secondary | ICD-10-CM

## 2013-04-07 DIAGNOSIS — R140 Abdominal distension (gaseous): Secondary | ICD-10-CM

## 2013-04-07 DIAGNOSIS — Z0000 Encounter for general adult medical examination without abnormal findings: Secondary | ICD-10-CM

## 2013-04-07 MED ORDER — FLUOXETINE HCL 20 MG PO CAPS: 20.0000 mg | ORAL_CAPSULE | Freq: Every day | ORAL | Status: DC

## 2013-04-07 MED ORDER — AMBULATORY NON FORMULARY MEDICATION: Status: DC

## 2013-04-07 MED ORDER — GEMFIBROZIL 600 MG PO TABS: 600.0000 mg | ORAL_TABLET | Freq: Two times a day (BID) | ORAL | Status: DC

## 2013-04-07 MED ORDER — OMEPRAZOLE 20 MG PO CPDR: 20.0000 mg | DELAYED_RELEASE_CAPSULE | Freq: Every day | ORAL | Status: DC

## 2013-04-07 NOTE — Progress Notes (Signed)
Subjective:    Taylor Hall is a 67 y.o. female who presents for Medicare Annual/Subsequent preventive examination.  Preventive Screening-Counseling & Management  Tobacco History  Smoking status  . Former Smoker -- 1.00 packs/day for 30 years  . Types: Cigarettes  . Quit date: 10/26/1980  Smokeless tobacco  . Never Used     Problems Prior to Visit 1.   Current Problems (verified) Patient Active Problem List   Diagnosis Date Noted  . GERD (gastroesophageal reflux disease) 10/14/2012  . Umbilical hernia 10/14/2012  . Other personal history presenting hazards to health 09/02/2012  . Anxiety state 09/02/2012  . Osteoarthrosis 09/02/2012  . Postmenopausal status (age-related) (natural) 09/02/2012  . Tubal ligation status 09/02/2012  . Incisional hernia 08/17/2012  . Status post Nissen fundoplication (without gastrostomy tube) procedure 05/17/2012  . Low back pain 09/16/2011  . Cardiomegaly 03/26/2011  . Interstitial lung disease 03/13/2011  . HYPERLIPIDEMIA 09/26/2010  . ANXIETY DEPRESSION 09/26/2010  . SHOULDER PAIN 09/26/2010    Medications Prior to Visit Current Outpatient Prescriptions on File Prior to Visit  Medication Sig Dispense Refill  . Acetylcysteine 600 MG CAPS Take 1 capsule by mouth 3 (three) times daily.       . AMBULATORY NON FORMULARY MEDICATION Medication Name: Pirfenidone 200mg   Take as directed      . aspirin 81 MG tablet Take 81 mg by mouth daily.      . Calcium-Vitamin D 600-125 MG-UNIT TABS Take 1 tablet by mouth daily.         No current facility-administered medications on file prior to visit.    Current Medications (verified) Current Outpatient Prescriptions  Medication Sig Dispense Refill  . Acetylcysteine 600 MG CAPS Take 1 capsule by mouth 3 (three) times daily.       . AMBULATORY NON FORMULARY MEDICATION Medication Name: Pirfenidone 200mg   Take as directed      . aspirin 81 MG tablet Take 81 mg by mouth daily.      . Calcium-Vitamin  D 600-125 MG-UNIT TABS Take 1 tablet by mouth daily.        Marland Kitchen FLUoxetine (PROZAC) 20 MG capsule Take 1 capsule (20 mg total) by mouth daily.  180 capsule  0  . gemfibrozil (LOPID) 600 MG tablet Take 1 tablet (600 mg total) by mouth 2 (two) times daily.  360 tablet  0  . omeprazole (PRILOSEC) 20 MG capsule Take 1 capsule (20 mg total) by mouth daily.  180 capsule  0   No current facility-administered medications for this visit.     Allergies (verified) Penicillins and Simvastatin   PAST HISTORY  Family History Family History  Problem Relation Age of Onset  . Cancer Mother   . Diabetes Mother   . 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 COPD Mother   . Allergies Mother   . Allergies Brother   . Heart disease Father     Social History History  Substance Use Topics  . Smoking status: Former Smoker -- 1.00 packs/day for 30 years    Types: Cigarettes    Quit date: 10/26/1980  . Smokeless tobacco: Never Used  . Alcohol Use: Yes     Comment: glass of wine     Are there smokers in your home (other than you)? No  Risk Factors Current exercise habits: regular exercise  Dietary issues discussed: none   Cardiac risk factors: advanced age (older than 65 for men, 10 for women) and  dyslipidemia.  Depression Screen (Note: if answer to either of the following is "Yes", a more complete depression screening is indicated)   Over the past two weeks, have you felt down, depressed or hopeless? No  Over the past two weeks, have you felt little interest or pleasure in doing things? No  Have you lost interest or pleasure in daily life? No  Do you often feel hopeless? No  Do you cry easily over simple problems? No  Activities of Daily Living In your present state of health, do you have any difficulty performing the following activities?:  Driving? No Managing money?  No Feeding yourself? No Getting from bed to chair? No Climbing a flight of stairs? No Preparing food  and eating?: No Bathing or showering? No Getting dressed: No Getting to the toilet? No Using the toilet:No Moving around from place to place: No In the past year have you fallen or had a near fall?:No   Are you sexually active?  Yes  Do you have more than one partner?  No  Hearing Difficulties: No Do you often ask people to speak up or repeat themselves? No Do you experience ringing or noises in your ears? No Do you have difficulty understanding soft or whispered voices? No   Do you feel that you have a problem with memory? No  Do you often misplace items? Yes  Do you feel safe at home?  No  Cognitive Testing  Alert? Yes  Normal Appearance?Yes  Oriented to person? Yes  Place? Yes   Time? Yes  Recall of three objects?  Yes  Can perform simple calculations? Yes  Displays appropriate judgment?Yes  Can read the correct time from a watch face?Yes   Advanced Directives have been discussed with the patient? Yes  List the Names of Other Physician/Practitioners you currently use: 1.  Dr. Delford Hall (pulm) 2.  Dr. Jon Hall- [facility] , pulm  Indicate any recent Medical Services you may have received from other than [facility] providers in the past year (date may be approximate).  Immunization History  Administered Date(s) Administered  . Influenza Split 07/21/2011, 08/18/2012  . Influenza Whole 09/26/2010  . Pneumococcal Polysaccharide 03/19/2011, 03/19/2011  . Tdap 05/17/2012    Screening Tests Health Maintenance  Topic Date Due  . Zostavax  10/01/2005  . Mammogram  03/16/2013  . Pap Smear  03/29/2013  . Influenza Vaccine  06/26/2013  . Colonoscopy  03/24/2021  . Tetanus/tdap  05/17/2022  . Pneumococcal Polysaccharide Vaccine Age 36 And Over  Completed    All answers were reviewed with the patient and necessary referrals were made:  Taylor Hanselman, MD   04/07/2013   History reviewed: allergies, current medications, past family history, past medical history, past social  history, past surgical history and problem list  Review of Systems A comprehensive review of systems was negative.    Objective:     Vision by Snellen chart: EYe exam is UTD.              Body mass index is 21.63 kg/(m^2). BP 141/88  Pulse 77  Ht 5' 3.75" (1.619 m)  Wt 125 lb (56.7 kg)  BMI 21.63 kg/m2  BP 126/72  Pulse 97  Ht 5' 3.75" (1.619 m)  Wt 125 lb (56.7 kg)  BMI 21.63 kg/m2  SpO2 97% General appearance: alert, cooperative and appears stated age Head: Normocephalic, without obvious abnormality, atraumatic Eyes: con clear, EOMi, PEERLA Ears: normal TM's and external ear canals both ears Nose: Nares normal. Septum  midline. Mucosa normal. No drainage or sinus tenderness. Throat: lips, mucosa, and tongue normal; teeth and gums normal Neck: no adenopathy, no carotid bruit, no JVD, supple, symmetrical, trachea midline and thyroid not enlarged, symmetric, no tenderness/mass/nodules Back: symmetric, no curvature. ROM normal. No CVA tenderness. Lungs: clear to auscultation bilaterally Breasts: normal appearance, no masses or tenderness Heart: regular rate and rhythm, S1, S2 normal, no murmur, click, rub or gallop Abdomen: soft, non-tender; bowel sounds normal; no masses,  no organomegaly Pelvic: cervix normal in appearance, external genitalia normal, no adnexal masses or tenderness, no cervical motion tenderness, rectovaginal septum normal, uterus normal size, shape, and consistency and vagina normal without discharge Extremities: extremities normal, atraumatic, no cyanosis or edema Pulses: 2+ and symmetric Skin: Skin color, texture, turgor normal. No rashes or lesions Lymph nodes: Cervical, supraclavicular, and axillary nodes normal. Neurologic: Alert and oriented X 3, normal strength and tone. Normal symmetric reflexes. Normal coordination and gait     Assessment:     Annual wellness Exam       Plan:     During the course of the visit the patient was educated and  counseled about appropriate screening and preventive services including:    Screening Pap smear and pelvic exam   Bone density  Mammogram  Pap smear performed. +HPV  rx for shingles vaccine. We called and insurance won't let us give it here.   Pulse ox 97% today, up from 94%.    Diet review for nutrition referral? Yes ____  Not Indicated _x__   Patient Instructions (the written plan) was given to the patient.  Medicare Attestation I have personally reviewed: The patient's medical and social history Their use of alcohol, tobacco or illicit drugs Their current medications and supplements The patient's functional ability including ADLs,fall risks, home safety risks, cognitive, and hearing and visual impairment Diet and physical activities Evidence for depression or mood disorders  The patient's weight, height, BMI, and visual acuity have been recorded in the chart.  I have made referrals, counseling, and provided education to the patient based on review of the above and I have provided the patient with a written personalized care plan for preventive services.     Norie Latendresse, MD   04/07/2013

## 2013-05-02 ENCOUNTER — Telehealth: Payer: Self-pay | Admitting: Critical Care Medicine

## 2013-05-02 NOTE — Telephone Encounter (Signed)
Refer this pt to [facility] [place]  They were to screen her for EAP pirfenidone trial

## 2013-05-02 NOTE — Telephone Encounter (Signed)
Spoke with patient-she states PW was working on a medication for her and aware that I will send this message to PW to advise. Pt aware that will call her back as soon as we know something from PW.

## 2013-05-03 NOTE — Telephone Encounter (Signed)
I spoke with pt and advised that we have notified [facility] to contact the pt. Carron Curie, CMA

## 2013-05-05 ENCOUNTER — Telehealth: Payer: Self-pay | Admitting: Critical Care Medicine

## 2013-05-05 MED ORDER — CLARITHROMYCIN 500 MG PO TABS: 500.0000 mg | ORAL_TABLET | Freq: Two times a day (BID) | ORAL | Status: DC

## 2013-05-05 NOTE — Telephone Encounter (Signed)
Called and spoke with pt and she is aware that per MW  Ok to send in the biaxin to her local pharmacy on [address].  Pt is aware that this has been sent in and nothing further is needed.

## 2013-05-05 NOTE — Telephone Encounter (Signed)
Fine with me to refill

## 2013-05-05 NOTE — Telephone Encounter (Signed)
Called and spoke with pt and she stated that she left her rx of biaxin at her other home and she feels that she may need this filled.  Pt c/o cough with clear sputum for now, her right lung just does not feel right, this started in her sinuses but now in her chest.  Pt is requesting that her biaxin be refilled.  PW is off this afternoon.  MW please advise. Thanks  Allergies  Allergen Reactions  . Penicillins   . Simvastatin     REACTION: myalgias

## 2013-05-09 ENCOUNTER — Ambulatory Visit (INDEPENDENT_AMBULATORY_CARE_PROVIDER_SITE_OTHER): Payer: Medicare Other

## 2013-05-09 ENCOUNTER — Ambulatory Visit: Payer: Medicare Other

## 2013-05-09 DIAGNOSIS — M899 Disorder of bone, unspecified: Secondary | ICD-10-CM

## 2013-05-09 DIAGNOSIS — N951 Menopausal and female climacteric states: Secondary | ICD-10-CM

## 2013-05-09 DIAGNOSIS — Z1231 Encounter for screening mammogram for malignant neoplasm of breast: Secondary | ICD-10-CM

## 2013-05-09 LAB — LIPID PANEL
Cholesterol: 204 mg/dL — ABNORMAL HIGH (ref 0–200)
HDL: 73 mg/dL (ref >39)
LDL Cholesterol: 121 mg/dL — ABNORMAL HIGH (ref 0–99)
Total CHOL/HDL Ratio: 2.8 Ratio
Triglycerides: 52 mg/dL (ref <150)
VLDL: 10 mg/dL (ref 0–40)

## 2013-05-09 LAB — CBC WITH DIFFERENTIAL/PLATELET
Eosinophils Absolute: 0.1 10*3/uL (ref 0.0–0.7)
HCT: 41.5 % (ref 36.0–46.0)
Hemoglobin: 14.5 g/dL (ref 12.0–15.0)
Lymphs Abs: 1 10*3/uL (ref 0.7–4.0)
MCH: 30.8 pg (ref 26.0–34.0)
MCHC: 34.9 g/dL (ref 30.0–36.0)
Monocytes Absolute: 0.5 10*3/uL (ref 0.1–1.0)
Monocytes Relative: 10 % (ref 3–12)
Neutrophils Relative %: 65 % (ref 43–77)
RBC: 4.71 MIL/uL (ref 3.87–5.11)

## 2013-05-09 LAB — COMPLETE METABOLIC PANEL WITH GFR
ALT: 10 U/L (ref 0–35)
AST: 16 U/L (ref 0–37)
Alkaline Phosphatase: 107 U/L (ref 39–117)
BUN: 18 mg/dL (ref 6–23)
Calcium: 9.6 mg/dL (ref 8.4–10.5)
Chloride: 105 mEq/L (ref 96–112)
Creat: 0.81 mg/dL (ref 0.50–1.10)
Total Bilirubin: 0.5 mg/dL (ref 0.3–1.2)

## 2013-05-10 LAB — GLIA (IGA/G) + TTG IGA: Gliadin IgA: 4.3 U/mL (ref <20)

## 2013-05-11 ENCOUNTER — Encounter: Payer: Self-pay | Admitting: Family Medicine

## 2013-05-11 DIAGNOSIS — M8580 Other specified disorders of bone density and structure, unspecified site: Secondary | ICD-10-CM | POA: Insufficient documentation

## 2013-06-23 ENCOUNTER — Telehealth: Payer: Self-pay | Admitting: Critical Care Medicine

## 2013-06-23 NOTE — Telephone Encounter (Signed)
left messages to schedule follow up apt. no return call back. sent letter 06/23/13 °

## 2013-09-27 ENCOUNTER — Telehealth: Payer: Self-pay | Admitting: *Deleted

## 2013-09-27 DIAGNOSIS — Z1211 Encounter for screening for malignant neoplasm of colon: Secondary | ICD-10-CM

## 2013-09-27 NOTE — Telephone Encounter (Signed)
I see one from 2012 but that is way old.  Put new order in. Tell her I'm sorry I didn't place order. Placed for [facility]

## 2013-09-27 NOTE — Telephone Encounter (Signed)
Pt states you were going to send her for a colonoscopy but I don't see a referral in the system. Pt states she wants to go to [facility] not anyone in [place].  Meyer Cory, LPN

## 2013-09-27 NOTE — Telephone Encounter (Signed)
Pt called and informed.Fantasy Donald [name]  

## 2013-10-03 ENCOUNTER — Encounter: Payer: Self-pay | Admitting: Internal Medicine

## 2013-10-17 ENCOUNTER — Ambulatory Visit (INDEPENDENT_AMBULATORY_CARE_PROVIDER_SITE_OTHER): Payer: Medicare Other

## 2013-10-17 ENCOUNTER — Encounter: Payer: Self-pay | Admitting: Family Medicine

## 2013-10-17 ENCOUNTER — Ambulatory Visit (INDEPENDENT_AMBULATORY_CARE_PROVIDER_SITE_OTHER): Payer: Medicare Other | Admitting: Family Medicine

## 2013-10-17 VITALS — BP 145/91 | HR 93 | Temp 98.3°F | Ht 64.0 in | Wt 129.0 lb

## 2013-10-17 DIAGNOSIS — J8410 Pulmonary fibrosis, unspecified: Secondary | ICD-10-CM

## 2013-10-17 DIAGNOSIS — J849 Interstitial pulmonary disease, unspecified: Secondary | ICD-10-CM

## 2013-10-17 DIAGNOSIS — K219 Gastro-esophageal reflux disease without esophagitis: Secondary | ICD-10-CM

## 2013-10-17 DIAGNOSIS — K449 Diaphragmatic hernia without obstruction or gangrene: Secondary | ICD-10-CM

## 2013-10-17 DIAGNOSIS — E785 Hyperlipidemia, unspecified: Secondary | ICD-10-CM

## 2013-10-17 DIAGNOSIS — F341 Dysthymic disorder: Secondary | ICD-10-CM

## 2013-10-17 MED ORDER — AMBULATORY NON FORMULARY MEDICATION: Status: DC

## 2013-10-17 MED ORDER — GEMFIBROZIL 600 MG PO TABS: 600.0000 mg | ORAL_TABLET | Freq: Two times a day (BID) | ORAL | Status: DC

## 2013-10-17 MED ORDER — FLUOXETINE HCL 20 MG PO CAPS: 20.0000 mg | ORAL_CAPSULE | Freq: Every day | ORAL | Status: DC

## 2013-10-17 MED ORDER — CLARITHROMYCIN 500 MG PO TABS: 500.0000 mg | ORAL_TABLET | Freq: Two times a day (BID) | ORAL | Status: DC

## 2013-10-17 MED ORDER — OMEPRAZOLE 20 MG PO CPDR: 20.0000 mg | DELAYED_RELEASE_CAPSULE | Freq: Every day | ORAL | Status: DC

## 2013-10-17 NOTE — Progress Notes (Signed)
Subjective:    Patient ID: Taylor Hall, female    DOB: [birth year], 67 y.o.   MRN: [id number]  HPI Intersitial lung disease. Says feels like can't feel her left lung.  Seen by Dr. Delford Field and at [facility] She is on clarithromycin now for cough and low grade temp.  On ABX x 4 months. No chest pain . No change in sputum.  Can still walk Still does water aerobics.  No wheezing.  Anxiety/Depression - Prozac doing well. No side effects or problems. She does not want to wean her change the medication.  Hyperlipidemia - On lopid. Unable to tolerate statins.    Review of Systems  BP 145/91  Pulse 93  Temp(Src) 98.3 F (36.8 C)  Ht 5\' 4"  (1.626 m)  Wt 129 lb (58.514 kg)  BMI 22.13 kg/m2  SpO2 93%    Allergies  Allergen Reactions  . Penicillins   . Simvastatin     REACTION: myalgias    Past Medical History  Diagnosis Date  . Hypertension   . Hyperlipemia   . History of DVT (deep vein thrombosis) 1960    Right lower extremity  . GERD (gastroesophageal reflux disease)   . Anemia   . Blood transfusion without reported diagnosis     Past Surgical History  Procedure Laterality Date  . Tubal ligation    . Appendectomy    . Varicose veins    . Rt rotator cuff      pain  . Cardiac catheterization    . Carpal tunnel release    . Tonsillectomy  1958  . Nissen fundoplication  01/14/2012    Dr. Alphonzo [facility]  . Lung biopsy  01/17/2012  . Hernia repair  09/14/2012    done @ [facility] 09/14/2012  . Hital hernia repair      Also had a nisofundification.    History   Social History  . Marital Status: Married    Spouse Name: N/A    Number of Children: 2  . Years of Education: N/A   Occupational History  . Retired    Social History Main Topics  . Smoking status: Former Smoker -- 1.00 packs/day for 30 years    Types: Cigarettes    Quit date: 10/26/1980  . Smokeless tobacco: Never Used  . Alcohol Use: Yes     Comment: glass of wine  . Drug Use: No  . Sexual Activity: Not  on file     Comment: retired, college education, married, does not regularly exercise   Other Topics Concern  . Not on file   Social History Narrative  . No narrative on file    Family History  Problem Relation Age of Onset  . Cancer Mother   . Diabetes Mother   . 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 COPD Mother   . Allergies Mother   . Allergies Brother   . Heart disease Father     Outpatient Encounter Prescriptions as of 10/17/2013  Medication Sig  . Acetylcysteine 600 MG CAPS Take 1 capsule by mouth 3 (three) times daily.   . AMBULATORY NON FORMULARY MEDICATION Medication Name: Pirfenidone 200mg   Take as directed  . AMBULATORY NON FORMULARY MEDICATION Zostavax IM x 1  . aspirin 81 MG tablet Take 81 mg by mouth daily.  . Calcium-Vitamin D 600-125 MG-UNIT TABS Take 1 tablet by mouth daily.    . clarithromycin (BIAXIN) 500 MG tablet Take 1 tablet (500  mg total) by mouth 2 (two) times daily.  Marland Kitchen FLUoxetine (PROZAC) 20 MG capsule Take 1 capsule (20 mg total) by mouth daily.  Marland Kitchen gemfibrozil (LOPID) 600 MG tablet Take 1 tablet (600 mg total) by mouth 2 (two) times daily.  . Magnesium 200 MG TABS Take 1 tablet by mouth.  Marland Kitchen omeprazole (PRILOSEC) 20 MG capsule Take 1 capsule (20 mg total) by mouth daily.  . [DISCONTINUED] AMBULATORY NON FORMULARY MEDICATION Zostavax IM x 1  . [DISCONTINUED] clarithromycin (BIAXIN) 500 MG tablet Take 500 mg by mouth 2 (two) times daily.  . [DISCONTINUED] FLUoxetine (PROZAC) 20 MG capsule Take 1 capsule (20 mg total) by mouth daily.  . [DISCONTINUED] gemfibrozil (LOPID) 600 MG tablet Take 1 tablet (600 mg total) by mouth 2 (two) times daily.  . [DISCONTINUED] omeprazole (PRILOSEC) 20 MG capsule Take 1 capsule (20 mg total) by mouth daily.  . [DISCONTINUED] clarithromycin (BIAXIN) 500 MG tablet Take 1 tablet (500 mg total) by mouth 2 (two) times daily.          Objective:   Physical Exam  Constitutional: She is oriented to  person, place, and time. She appears well-developed and well-nourished.  HENT:  Head: Normocephalic and atraumatic.  Cardiovascular: Normal rate, regular rhythm and normal heart sounds.   Pulmonary/Chest: Effort normal and breath sounds normal.  End inspiratory crackles, diffuse  Neurological: She is alert and oriented to person, place, and time.  Skin: Skin is warm and dry.  Psychiatric: She has a normal mood and affect. Her behavior is normal.          Assessment & Plan:  ILD - will get CXR today. She has been on clarithromycin for approximately 4 days at this point. We'll evaluate for pneumothorax or possibly pneumonia since she does feel a difference in how she's breathing on that left side. Fortunately she has not had any pain. She would like refill on clarithromycin and [place] and would like to have a backup prescription in case she needs to get it filled.  Anxiety/Depression - continue fluoxetine at current dose. No changes today. Followup in 6 months.  Hyperlipidemia -we'll recheck lipids at followup visit. Continue current regimen. Refill sent to pharmacy.  GERD-she does have a history of a Nissen fundoplication. Did discuss with her new study showing increased risk of fractures on long-term PPI use. If her symptoms are well-controlled she could consider discussing with her physician changing to an H2 blocker for control of symptoms.

## 2013-12-08 ENCOUNTER — Encounter: Payer: Medicare Other | Admitting: Internal Medicine

## 2014-04-10 ENCOUNTER — Encounter: Payer: Medicare Other | Admitting: Family Medicine

## 2014-04-17 ENCOUNTER — Ambulatory Visit: Payer: Medicare Other | Admitting: Family Medicine

## 2014-04-24 ENCOUNTER — Other Ambulatory Visit: Payer: Self-pay | Admitting: Family Medicine

## 2014-04-24 DIAGNOSIS — Z1231 Encounter for screening mammogram for malignant neoplasm of breast: Secondary | ICD-10-CM

## 2014-05-17 ENCOUNTER — Ambulatory Visit (INDEPENDENT_AMBULATORY_CARE_PROVIDER_SITE_OTHER): Payer: Medicare Other | Admitting: Family Medicine

## 2014-05-17 ENCOUNTER — Encounter: Payer: Self-pay | Admitting: Family Medicine

## 2014-05-17 ENCOUNTER — Ambulatory Visit: Payer: Medicare Other

## 2014-05-17 ENCOUNTER — Telehealth: Payer: Self-pay | Admitting: *Deleted

## 2014-05-17 ENCOUNTER — Ambulatory Visit (INDEPENDENT_AMBULATORY_CARE_PROVIDER_SITE_OTHER): Payer: Medicare Other

## 2014-05-17 VITALS — BP 139/87 | HR 67 | Ht 64.0 in | Wt 132.0 lb

## 2014-05-17 DIAGNOSIS — F3289 Other specified depressive episodes: Secondary | ICD-10-CM

## 2014-05-17 DIAGNOSIS — E785 Hyperlipidemia, unspecified: Secondary | ICD-10-CM

## 2014-05-17 DIAGNOSIS — Z13220 Encounter for screening for lipoid disorders: Secondary | ICD-10-CM

## 2014-05-17 DIAGNOSIS — J849 Interstitial pulmonary disease, unspecified: Secondary | ICD-10-CM

## 2014-05-17 DIAGNOSIS — F32A Depression, unspecified: Secondary | ICD-10-CM

## 2014-05-17 DIAGNOSIS — Z1231 Encounter for screening mammogram for malignant neoplasm of breast: Secondary | ICD-10-CM

## 2014-05-17 DIAGNOSIS — Z1211 Encounter for screening for malignant neoplasm of colon: Secondary | ICD-10-CM

## 2014-05-17 DIAGNOSIS — J8410 Pulmonary fibrosis, unspecified: Secondary | ICD-10-CM

## 2014-05-17 DIAGNOSIS — F329 Major depressive disorder, single episode, unspecified: Secondary | ICD-10-CM

## 2014-05-17 DIAGNOSIS — K219 Gastro-esophageal reflux disease without esophagitis: Secondary | ICD-10-CM

## 2014-05-17 MED ORDER — AMBULATORY NON FORMULARY MEDICATION: Status: DC

## 2014-05-17 MED ORDER — FLUOXETINE HCL 40 MG PO CAPS: 40.0000 mg | ORAL_CAPSULE | Freq: Every day | ORAL | Status: DC

## 2014-05-17 NOTE — Progress Notes (Addendum)
Subjective:    Patient ID: Taylor Hall, female    DOB: Oct 05, 1946, 68 y.o.   MRN: [id number]  HPI GERD - She is on prilosec. Controlling her symptoms. She does take it daily. She is very active and does take calcium with vitamin D.  Interstitial lung disease-she is followed at [facility] and they are actually scheduling an assessment for possible transplant. She's a little bit nervous about this and admits that her anxieties been up a little bit. She also reports that she had the Prevnar 13 in June at [facility]. She still has not had her shingles vaccine. She is now wearing continuous oxygen and has her oxygen compressor with her.  Anxiety/Depression - She is on fluoxetine 20mg .  Waking up frequently at night recently. Only recent stressor is getting evaluated for lung transplant. She does not use any sleep aids. She's is difficult to just get her brain to turn off. She's currently tolerating fluoxetine well without any side effects or problems..   Review of Systems BP 139/87  Pulse 67  Ht 5\' 4"  (1.626 m)  Wt 132 lb (59.875 kg)  BMI 22.65 kg/m2  SpO2 94%    Allergies  Allergen Reactions  . Penicillins Hives  . Simvastatin     REACTION: myalgias    Past Medical History  Diagnosis Date  . Hypertension   . Hyperlipemia   . History of DVT (deep vein thrombosis) 1960    Right lower extremity  . GERD (gastroesophageal reflux disease)   . Anemia   . Blood transfusion without reported diagnosis     Past Surgical History  Procedure Laterality Date  . Tubal ligation    . Appendectomy    . Varicose veins    . Rt rotator cuff      pain  . Cardiac catheterization    . Carpal tunnel release    . Tonsillectomy  1958  . Nissen fundoplication  01/14/2012    Dr. Alphonzo [facility]  . Lung biopsy  01/17/2012  . Hernia repair  09/14/2012    done @ [facility] 09/14/2012  . Hital hernia repair      Also had a nisofundification.    History   Social History  . Marital Status: Married    Spouse  Name: N/A    Number of Children: 2  . Years of Education: N/A   Occupational History  . Retired    Social History Main Topics  . Smoking status: Former Smoker -- 1.00 packs/day for 30 years    Types: Cigarettes    Quit date: 10/26/1980  . Smokeless tobacco: Never Used  . Alcohol Use: Yes     Comment: glass of wine  . Drug Use: No  . Sexual Activity: Not on file     Comment: retired, college education, married, does not regularly exercise   Other Topics Concern  . Not on file   Social History Narrative  . No narrative on file    Family History  Problem Relation Age of Onset  . Cancer Mother   . Diabetes Mother   . 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 COPD Mother   . Allergies Mother   . Allergies Brother   . Heart disease Father     Outpatient Encounter Prescriptions as of 05/17/2014  Medication Sig  . Acetylcysteine 600 MG CAPS Take 1 capsule by mouth 3 (three) times daily.   . AMBULATORY NON FORMULARY MEDICATION Medication Name:  Pirfenidone 200mg   Take as directed  . AMBULATORY NON FORMULARY MEDICATION Zostavax IM x 1  . AMBULATORY NON FORMULARY MEDICATION Medication Name: Shingles vaccine IM x 1  . aspirin 81 MG tablet Take 81 mg by mouth daily.  . Calcium-Vitamin D 600-125 MG-UNIT TABS Take 1 tablet by mouth daily.    . clarithromycin (BIAXIN) 500 MG tablet Take 1 tablet (500 mg total) by mouth 2 (two) times daily.  Marland Kitchen. FLUoxetine (PROZAC) 40 MG capsule Take 1 capsule (40 mg total) by mouth daily.  Marland Kitchen. gemfibrozil (LOPID) 600 MG tablet Take 1 tablet (600 mg total) by mouth 2 (two) times daily.  . Magnesium 200 MG TABS Take 1 tablet by mouth.  Marland Kitchen. omeprazole (PRILOSEC) 20 MG capsule Take 1 capsule (20 mg total) by mouth daily.  . [DISCONTINUED] FLUoxetine (PROZAC) 20 MG capsule Take 1 capsule (20 mg total) by mouth daily.          Objective:   Physical Exam  Constitutional: She is oriented to person, place, and time. She appears  well-developed and well-nourished.  HENT:  Head: Normocephalic and atraumatic.  Cardiovascular: Normal rate, regular rhythm and normal heart sounds.   Pulmonary/Chest: Effort normal and breath sounds normal.  Neurological: She is alert and oriented to person, place, and time.  Skin: Skin is warm and dry.  Psychiatric: She has a normal mood and affect. Her behavior is normal.          Assessment & Plan:  Hyperlipidemia - due to recheck lipids. Lab slip provided.  Anxiety/Depression -GAD-7 score of 3 and PHQ-9 score of 2. .  we discussed different options. I suspect that her recent change in sleep quality is probably related to increased stress levels being evaluated for possible transplant and being nervous about whether not she will be excepted. We discussed different options. For now I think the best option would be to increase the fluoxetine. I want to hold off on anything particularly sedative for sleep if at all possible. Increased fluoxetine to 40mg . if she's having any problems or side effects she will let me know. Otherwise followup in 6 months. If things settle back down but denies decreased back to 20 mg which has worked very well for her up until this point.  GERD - well controlled. Continue current regimen. She is active and takes calcium with vitamin D.  Interstitial lung disease-[facility] and by Dr. Delford FieldWright with [facility]. Now on continuous oxygen. Currently being evaluated for possible future transplant.  Also she had a death in the family in January and was unable to make her colonoscopy. She is ready to reschedule that and would prefer to have it done with [facility]. Referral placed today.

## 2014-05-17 NOTE — Telephone Encounter (Signed)
Pt called and stated that her records from [facility] needed to be faxed to [facility]. Records were faxed.Taylor PacasBarkley, Anatole Apollo KincheloeLynetta

## 2014-05-18 LAB — LIPID PANEL
CHOL/HDL RATIO: 2.4 ratio
Cholesterol: 178 mg/dL (ref 0–200)
HDL: 75 mg/dL (ref >39)
LDL Cholesterol: 93 mg/dL (ref 0–99)
Triglycerides: 48 mg/dL (ref <150)
VLDL: 10 mg/dL (ref 0–40)

## 2014-05-18 LAB — COMPLETE METABOLIC PANEL WITH GFR
ALK PHOS: 108 U/L (ref 39–117)
ALT: 8 U/L (ref 0–35)
AST: 16 U/L (ref 0–37)
Albumin: 4.4 g/dL (ref 3.5–5.2)
BILIRUBIN TOTAL: 0.5 mg/dL (ref 0.2–1.2)
BUN: 18 mg/dL (ref 6–23)
CO2: 29 mEq/L (ref 19–32)
Calcium: 9.3 mg/dL (ref 8.4–10.5)
Chloride: 99 mEq/L (ref 96–112)
Creat: 0.79 mg/dL (ref 0.50–1.10)
GFR, Est African American: 89 mL/min
GFR, Est Non African American: 77 mL/min
Glucose, Bld: 69 mg/dL — ABNORMAL LOW (ref 70–99)
Potassium: 4.5 mEq/L (ref 3.5–5.3)
SODIUM: 140 meq/L (ref 135–145)
TOTAL PROTEIN: 7.4 g/dL (ref 6.0–8.3)

## 2014-05-23 ENCOUNTER — Telehealth: Payer: Self-pay | Admitting: *Deleted

## 2014-05-23 NOTE — Telephone Encounter (Signed)
Pt is checking on the status of the GI referal

## 2014-05-23 NOTE — Telephone Encounter (Signed)
I called [facility] and spoke with Vladimir Croftssmeralda and she states Dr is reviewing notes. Patient has been updated.

## 2014-05-25 ENCOUNTER — Telehealth: Payer: Self-pay

## 2014-05-25 NOTE — Telephone Encounter (Signed)
Taylor MiresFYI [name] is very upset because she just found out she has elevated CAE. She is wanting to get the colonoscopy asap. I called [facility] to give them the new information. They informed me they will call her and schedule an office visit for Wednesday at 2 pm.

## 2014-05-29 ENCOUNTER — Encounter: Payer: Self-pay | Admitting: *Deleted

## 2014-05-30 ENCOUNTER — Encounter: Payer: Self-pay | Admitting: Nurse Practitioner

## 2014-05-30 ENCOUNTER — Ambulatory Visit (INDEPENDENT_AMBULATORY_CARE_PROVIDER_SITE_OTHER): Payer: Medicare Other | Admitting: Nurse Practitioner

## 2014-05-30 VITALS — BP 124/60 | HR 66 | Ht 63.5 in | Wt 129.6 lb

## 2014-05-30 DIAGNOSIS — R1013 Epigastric pain: Secondary | ICD-10-CM

## 2014-05-30 DIAGNOSIS — Z86010 Personal history of colonic polyps: Secondary | ICD-10-CM

## 2014-05-30 DIAGNOSIS — K219 Gastro-esophageal reflux disease without esophagitis: Secondary | ICD-10-CM

## 2014-05-30 MED ORDER — NA SULFATE-K SULFATE-MG SULF 17.5-3.13-1.6 GM/177ML PO SOLN: 1.0000 | Freq: Once | ORAL | Status: DC

## 2014-05-30 NOTE — Progress Notes (Signed)
HPI :  Patient is a 68 year old female, new to this practice, here for evaluation of colonoscopy for colon cancer screening.  She had a colonoscopy and upper endoscopy 03/25/11 at [facility] in [place] for evaluation or deficiency anemia.  Upper endoscopy was normal except for a medium-size hiatal hernia. Several polyps were removed from the colon and she had sigmoid diverticulosis. One of the polyps, a 14mm sessile polyp in the ascending colon was tattooed then removed in a piecemeal fashion. Records from [facility] includes a letter to patient advising repeat colonoscopy in 2 months for reassessment of that polypectomy site but patient apparently didn't receive the letter.  I do not have colon pathology reports   Two to three years ago patient was diagnosed with idiopathic pulmonary fibrosis. She is be evaluated at [facility] for [facility]. Transplant program has requested colonoscopy as well as an upper endoscopy. Patient has not had any overt GI bleeding. Bowel movements are unchanged. Patient has chronic constipation, and twice daily MiraLax helps. She does give a several day history of intermittent upper abdominal discomfort . She has some nausea but wonders if that could be medication related. Patient takes a baby aspirin, and occasional Advil. She's been on 20 mg of omeprazole daily. Of note, patient is status post Nissen fundoplication.  Past Medical History  Diagnosis Date  . Hypertension   . Hyperlipemia   . History of DVT (deep vein thrombosis) 1960    Right lower extremity  . GERD (gastroesophageal reflux disease)   . Anemia   . Blood transfusion without reported diagnosis   . Internal hemorrhoids 03/26/2011  . Colon polyps 03/26/2011  . Diverticulosis     In sigmoid colon  . Idiopathic pulmonary fibrosis     Family History  Problem Relation Age of Onset  . Cancer Mother   . Diabetes Mother   . Lung cancer Father     Black Lung  working in gold mines  . COPD Mother   . Allergies Mother   . Allergies Brother   . Heart disease Father    History  Substance Use Topics  . Smoking status: Former Smoker -- 1.00 packs/day for 30 years    Types: Cigarettes    Quit date: 10/26/1980  . Smokeless tobacco: Never Used  . Alcohol Use: Yes     Comment: glass of wine   Current Outpatient Prescriptions  Medication Sig Dispense Refill  . Acetylcysteine 600 MG CAPS Take 1 capsule by mouth 3 (three) times daily.       . AMBULATORY NON FORMULARY MEDICATION Zostavax IM x 1  1 vial  0  . AMBULATORY NON FORMULARY MEDICATION Medication Name: Shingles vaccine IM x 1  1 vial  0  . AMBULATORY NON FORMULARY MEDICATION Medication Name: Pirfenidone 200mg    1800 mg daily Take as directed      . aspirin 81 MG tablet Take 81 mg by mouth daily.      . Calcium-Vitamin D 600-125 MG-UNIT TABS Take 1 tablet by mouth daily.        Marland Kitchen. FLUoxetine (PROZAC) 40 MG capsule Take 1 capsule (40 mg total) by mouth daily.  90 capsule  1  . gemfibrozil (LOPID) 600 MG tablet Take 1 tablet (600 mg total) by mouth 2 (two) times daily.  720 tablet  0  . Magnesium 200 MG TABS Take 1 tablet by mouth.      Marland Kitchen. omeprazole (PRILOSEC) 20 MG capsule Take 1 capsule (20  mg total) by mouth daily.  360 capsule  0   No current facility-administered medications for this visit.   Allergies  Allergen Reactions  . Penicillins Hives  . Simvastatin     REACTION: myalgias    Review of Systems: All systems reviewed and negative except where noted in HPI.    Physical Exam: BP 124/60  Pulse 66  Ht 5' 3.5" (1.613 m)  Wt 129 lb 9.6 oz (58.786 kg)  BMI 22.59 kg/m2 Constitutional: Pleasant,well-developed, white  female in no acute distress. HEENT: Normocephalic and atraumatic. Conjunctivae are normal. No scleral icterus. Neck supple.  Cardiovascular: Normal rate, regular rhythm.  Pulmonary/chest: Effort normal and breath sounds normal. Mild "velcro" type breath sounds at  bilateral bases. Abdominal: Soft, nondistended, nontender. Bowel sounds active throughout. There are no masses palpable. No hepatomegaly. Extremities: no edema Lymphadenopathy: No cervical adenopathy noted. Neurological: Alert and oriented to person place and time. Skin: Skin is warm and dry. No rashes noted. Psychiatric: Normal mood and affect. Behavior is normal.   ASSESSMENT AND PLAN: 79. 68 year old female in process of undergoing lung transplant evaluation at [facility] for idiopathic pulmonary fibrosis. Due to his recommended she have a upper and lower endoscopy as part of transplant evaluation. Patient does have a history of colon polyps, see HPI. The risks, benefits, and alternatives to colonoscopy with possible biopsy and possible polypectomy were discussed with the patient and she consents to proceed.  The patient is not on continuous oxygen she does require oxygen therapy at times. She will need to be done at [facility] with monitored anesthesia care.  2. Upper abdominal pain and nausea. For further evaluation of symptoms (and also as requested by [facility] for lung transplant evaluation) patient will be scheduled for upper endoscopy. The benefits, risks, and potential complications of EGD with possible biopsies and/or dilation were discussed with the patient and she agrees to proceed.    3. Remote Nissen fundoplication  4. Idiopathic pulmonary fibrosis.

## 2014-05-30 NOTE — Patient Instructions (Signed)
You have been scheduled for an endoscopy and colonoscopy. Please follow the written instructions given to you at your visit today. Please pick up your prep at the pharmacy within the next 1-3 days. Suprep If you use inhalers (even only as needed), please bring them with you on the day of your procedure. Your physician has requested that you go to www.startemmi.com and enter the access code given to you at your visit today. This web site gives a general overview about your procedure. However, you should still follow specific instructions given to you by our office regarding your preparation for the procedure.

## 2014-05-31 ENCOUNTER — Encounter: Payer: Self-pay | Admitting: Nurse Practitioner

## 2014-05-31 DIAGNOSIS — R1013 Epigastric pain: Secondary | ICD-10-CM | POA: Insufficient documentation

## 2014-05-31 DIAGNOSIS — Z860100 Personal history of colon polyps, unspecified: Secondary | ICD-10-CM | POA: Insufficient documentation

## 2014-05-31 NOTE — Progress Notes (Signed)
Reviewed and agree with management plan.  Malcolm T. Stark, MD FACG 

## 2014-06-01 ENCOUNTER — Encounter (HOSPITAL_COMMUNITY): Payer: Self-pay | Admitting: Pharmacy Technician

## 2014-06-04 ENCOUNTER — Encounter (HOSPITAL_COMMUNITY): Payer: Self-pay | Admitting: *Deleted

## 2014-06-04 NOTE — Progress Notes (Signed)
lov note dr Hector Brunswickjamie lynn todd transplant dr Kateri Mcduke 05-22-14 on chart send by cindy lawrence transplant coordinator rn, msn phone number [phone], last chest xray 05-22-14 [facility] on chart

## 2014-06-05 ENCOUNTER — Ambulatory Visit (HOSPITAL_COMMUNITY)
Admission: RE | Admit: 2014-06-05 | Discharge: 2014-06-05 | Disposition: A | Discharge status: Home / Self Care | Payer: Medicare Other | Source: Ambulatory Visit | Attending: Internal Medicine | Admitting: Internal Medicine

## 2014-06-05 ENCOUNTER — Encounter (HOSPITAL_COMMUNITY)
Admission: RE | Disposition: A | Discharge status: Home / Self Care | Payer: Self-pay | Source: Ambulatory Visit | Attending: Internal Medicine

## 2014-06-05 ENCOUNTER — Ambulatory Visit (HOSPITAL_COMMUNITY): Payer: Medicare Other | Admitting: Anesthesiology

## 2014-06-05 ENCOUNTER — Encounter (HOSPITAL_COMMUNITY): Payer: Medicare Other | Admitting: Anesthesiology

## 2014-06-05 ENCOUNTER — Encounter (HOSPITAL_COMMUNITY): Payer: Self-pay | Admitting: Certified Registered"

## 2014-06-05 DIAGNOSIS — Z860100 Personal history of colon polyps, unspecified: Secondary | ICD-10-CM

## 2014-06-05 DIAGNOSIS — Z880 Allergy status to penicillin: Secondary | ICD-10-CM | POA: Diagnosis not present

## 2014-06-05 DIAGNOSIS — E785 Hyperlipidemia, unspecified: Secondary | ICD-10-CM | POA: Diagnosis not present

## 2014-06-05 DIAGNOSIS — R110 Nausea: Secondary | ICD-10-CM | POA: Insufficient documentation

## 2014-06-05 DIAGNOSIS — Z79899 Other long term (current) drug therapy: Secondary | ICD-10-CM | POA: Diagnosis not present

## 2014-06-05 DIAGNOSIS — K6389 Other specified diseases of intestine: Secondary | ICD-10-CM | POA: Insufficient documentation

## 2014-06-05 DIAGNOSIS — Z809 Family history of malignant neoplasm, unspecified: Secondary | ICD-10-CM | POA: Insufficient documentation

## 2014-06-05 DIAGNOSIS — K449 Diaphragmatic hernia without obstruction or gangrene: Secondary | ICD-10-CM | POA: Insufficient documentation

## 2014-06-05 DIAGNOSIS — Z86718 Personal history of other venous thrombosis and embolism: Secondary | ICD-10-CM | POA: Diagnosis not present

## 2014-06-05 DIAGNOSIS — Z7982 Long term (current) use of aspirin: Secondary | ICD-10-CM | POA: Diagnosis not present

## 2014-06-05 DIAGNOSIS — K5730 Diverticulosis of large intestine without perforation or abscess without bleeding: Secondary | ICD-10-CM | POA: Diagnosis not present

## 2014-06-05 DIAGNOSIS — J8410 Pulmonary fibrosis, unspecified: Secondary | ICD-10-CM | POA: Diagnosis not present

## 2014-06-05 DIAGNOSIS — Z888 Allergy status to other drugs, medicaments and biological substances status: Secondary | ICD-10-CM | POA: Diagnosis not present

## 2014-06-05 DIAGNOSIS — R109 Unspecified abdominal pain: Secondary | ICD-10-CM | POA: Diagnosis not present

## 2014-06-05 DIAGNOSIS — Z87891 Personal history of nicotine dependence: Secondary | ICD-10-CM | POA: Diagnosis not present

## 2014-06-05 DIAGNOSIS — K219 Gastro-esophageal reflux disease without esophagitis: Secondary | ICD-10-CM

## 2014-06-05 DIAGNOSIS — I10 Essential (primary) hypertension: Secondary | ICD-10-CM | POA: Diagnosis not present

## 2014-06-05 DIAGNOSIS — K2940 Chronic atrophic gastritis without bleeding: Secondary | ICD-10-CM | POA: Insufficient documentation

## 2014-06-05 DIAGNOSIS — Z1211 Encounter for screening for malignant neoplasm of colon: Secondary | ICD-10-CM | POA: Diagnosis not present

## 2014-06-05 HISTORY — PX: ESOPHAGOGASTRODUODENOSCOPY (EGD) WITH PROPOFOL: SHX5813

## 2014-06-05 HISTORY — PX: COLONOSCOPY: SHX5424

## 2014-06-05 HISTORY — DX: Dependence on supplemental oxygen: Z99.81

## 2014-06-05 LAB — GLUCOSE, CAPILLARY: GLUCOSE-CAPILLARY: 77 mg/dL (ref 70–99)

## 2014-06-05 SURGERY — COLONOSCOPY
Anesthesia: Monitor Anesthesia Care

## 2014-06-05 MED ORDER — SODIUM CHLORIDE 0.9 % IV SOLN: INTRAVENOUS | Status: DC

## 2014-06-05 MED ORDER — LIDOCAINE HCL (PF) 2 % IJ SOLN
INTRAMUSCULAR | Status: DC | PRN
  Administered 2014-06-05: 20 mg via INTRADERMAL

## 2014-06-05 MED ORDER — LACTATED RINGERS IV SOLN: INTRAVENOUS | Status: DC | PRN

## 2014-06-05 MED ORDER — PROPOFOL INFUSION 10 MG/ML OPTIME
INTRAVENOUS | Status: DC | PRN
  Administered 2014-06-05: 140 ug/kg/min via INTRAVENOUS

## 2014-06-05 MED ORDER — EPHEDRINE SULFATE 50 MG/ML IJ SOLN
INTRAMUSCULAR | Status: DC | PRN
  Administered 2014-06-05: 10 mg via INTRAVENOUS

## 2014-06-05 MED ORDER — EPHEDRINE SULFATE 50 MG/ML IJ SOLN
INTRAMUSCULAR | Status: AC
  Filled 2014-06-05: qty 1

## 2014-06-05 MED ORDER — PROPOFOL 10 MG/ML IV BOLUS
INTRAVENOUS | Status: DC | PRN
  Administered 2014-06-05: 100 mg via INTRAVENOUS

## 2014-06-05 MED ORDER — LACTATED RINGERS IV SOLN
INTRAVENOUS | Status: DC | PRN
  Administered 2014-06-05: 07:00:00 via INTRAVENOUS

## 2014-06-05 MED ORDER — PROPOFOL 10 MG/ML IV BOLUS
INTRAVENOUS | Status: AC
  Filled 2014-06-05: qty 20

## 2014-06-05 MED ORDER — LIDOCAINE HCL (CARDIAC) 20 MG/ML IV SOLN
INTRAVENOUS | Status: AC
  Filled 2014-06-05: qty 5

## 2014-06-05 MED ORDER — SODIUM CHLORIDE 0.9 % IJ SOLN
INTRAMUSCULAR | Status: AC
  Filled 2014-06-05: qty 10

## 2014-06-05 SURGICAL SUPPLY — 14 items

## 2014-06-05 NOTE — Anesthesia Postprocedure Evaluation (Signed)
  Anesthesia Post-op Note  Patient: Taylor Hall  Procedure(s) Performed: Procedure(s) (LRB): COLONOSCOPY w/ PROPOFOL (N/A) ESOPHAGOGASTRODUODENOSCOPY (EGD) WITH PROPOFOL (N/A)  Patient Location: PACU  Anesthesia Type: MAC  Level of Consciousness: awake and alert   Airway and Oxygen Therapy: Patient Spontanous Breathing  Post-op Pain: mild  Post-op Assessment: Post-op Vital signs reviewed, Patient's Cardiovascular Status Stable, Respiratory Function Stable, Patent Airway and No signs of Nausea or vomiting  Last Vitals:  Filed Vitals:   06/05/14 0854  BP:   Pulse: 57  Temp:   Resp: 17    Post-op Vital Signs: stable   Complications: No apparent anesthesia complications

## 2014-06-05 NOTE — Interval H&P Note (Signed)
History and Physical Interval Note:  06/05/2014 7:42 AM  Taylor ReeveVivian Massmann  has presented today for surgery, with the diagnosis of History of colon polyps Upper abdominal pain  The various methods of treatment have been discussed with the patient and family. After consideration of risks, benefits and other options for treatment, the patient has consented to  Procedure(s): COLONOSCOPY w/ PROPOFOL (N/A) ESOPHAGOGASTRODUODENOSCOPY (EGD) WITH PROPOFOL (N/A) as a surgical intervention .  The patient's history has been reviewed, patient examined, no change in status, stable for surgery.  I have reviewed the patient's chart and labs.  Questions were answered to the patient's satisfaction.     [name]

## 2014-06-05 NOTE — H&P (View-Only) (Signed)
HPI :  Patient is a 68 year old female, new to this practice, here for evaluation of colonoscopy for colon cancer screening.  She had a colonoscopy and upper endoscopy 03/25/11 at [facility] in [place] for evaluation or deficiency anemia.  Upper endoscopy was normal except for a medium-size hiatal hernia. Several polyps were removed from the colon and she had sigmoid diverticulosis. One of the polyps, a 14mm sessile polyp in the ascending colon was tattooed then removed in a piecemeal fashion. Records from [facility] includes a letter to patient advising repeat colonoscopy in 2 months for reassessment of that polypectomy site but patient apparently didn't receive the letter.  I do not have colon pathology reports   Two to three years ago patient was diagnosed with idiopathic pulmonary fibrosis. She is be evaluated at [facility] for lung transplant. Transplant program has requested colonoscopy as well as an upper endoscopy. Patient has not had any overt GI bleeding. Bowel movements are unchanged. Patient has chronic constipation, and twice daily MiraLax helps. She does give a several day history of intermittent upper abdominal discomfort . She has some nausea but wonders if that could be medication related. Patient takes a baby aspirin, and occasional Advil. She's been on 20 mg of omeprazole daily. Of note, patient is status post Nissen fundoplication.  Past Medical History  Diagnosis Date  . Hypertension   . Hyperlipemia   . History of DVT (deep vein thrombosis) 1960    Right lower extremity  . GERD (gastroesophageal reflux disease)   . Anemia   . Blood transfusion without reported diagnosis   . Internal hemorrhoids 03/26/2011  . Colon polyps 03/26/2011  . Diverticulosis     In sigmoid colon  . Idiopathic pulmonary fibrosis     Family History  Problem Relation Age of Onset  . Cancer Mother   . Diabetes Mother   . Lung cancer Father     Black Lung  working in gold mines  . COPD Mother   . Allergies Mother   . Allergies Brother   . Heart disease Father    History  Substance Use Topics  . Smoking status: Former Smoker -- 1.00 packs/day for 30 years    Types: Cigarettes    Quit date: 10/26/1980  . Smokeless tobacco: Never Used  . Alcohol Use: Yes     Comment: glass of wine   Current Outpatient Prescriptions  Medication Sig Dispense Refill  . Acetylcysteine 600 MG CAPS Take 1 capsule by mouth 3 (three) times daily.       . AMBULATORY NON FORMULARY MEDICATION Zostavax IM x 1  1 vial  0  . AMBULATORY NON FORMULARY MEDICATION Medication Name: Shingles vaccine IM x 1  1 vial  0  . AMBULATORY NON FORMULARY MEDICATION Medication Name: Pirfenidone 200mg    1800 mg daily Take as directed      . aspirin 81 MG tablet Take 81 mg by mouth daily.      . Calcium-Vitamin D 600-125 MG-UNIT TABS Take 1 tablet by mouth daily.        Marland Kitchen. FLUoxetine (PROZAC) 40 MG capsule Take 1 capsule (40 mg total) by mouth daily.  90 capsule  1  . gemfibrozil (LOPID) 600 MG tablet Take 1 tablet (600 mg total) by mouth 2 (two) times daily.  720 tablet  0  . Magnesium 200 MG TABS Take 1 tablet by mouth.      Marland Kitchen. omeprazole (PRILOSEC) 20 MG capsule Take 1 capsule (20  mg total) by mouth daily.  360 capsule  0   No current facility-administered medications for this visit.   Allergies  Allergen Reactions  . Penicillins Hives  . Simvastatin     REACTION: myalgias    Review of Systems: All systems reviewed and negative except where noted in HPI.    Physical Exam: BP 124/60  Pulse 66  Ht 5' 3.5" (1.613 m)  Wt 129 lb 9.6 oz (58.786 kg)  BMI 22.59 kg/m2 Constitutional: Pleasant,well-developed, white  female in no acute distress. HEENT: Normocephalic and atraumatic. Conjunctivae are normal. No scleral icterus. Neck supple.  Cardiovascular: Normal rate, regular rhythm.  Pulmonary/chest: Effort normal and breath sounds normal. Mild "velcro" type breath sounds at  bilateral bases. Abdominal: Soft, nondistended, nontender. Bowel sounds active throughout. There are no masses palpable. No hepatomegaly. Extremities: no edema Lymphadenopathy: No cervical adenopathy noted. Neurological: Alert and oriented to person place and time. Skin: Skin is warm and dry. No rashes noted. Psychiatric: Normal mood and affect. Behavior is normal.   ASSESSMENT AND PLAN: 79. 68 year old female in process of undergoing lung transplant evaluation at [facility] for idiopathic pulmonary fibrosis. Due to his recommended she have a upper and lower endoscopy as part of transplant evaluation. Patient does have a history of colon polyps, see HPI. The risks, benefits, and alternatives to colonoscopy with possible biopsy and possible polypectomy were discussed with the patient and she consents to proceed.  The patient is not on continuous oxygen she does require oxygen therapy at times. She will need to be done at [facility] with monitored anesthesia care.  2. Upper abdominal pain and nausea. For further evaluation of symptoms (and also as requested by [facility] for lung transplant evaluation) patient will be scheduled for upper endoscopy. The benefits, risks, and potential complications of EGD with possible biopsies and/or dilation were discussed with the patient and she agrees to proceed.    3. Remote Nissen fundoplication  4. Idiopathic pulmonary fibrosis.

## 2014-06-05 NOTE — Op Note (Signed)
[facility] [address] [place], [id number]   ENDOSCOPY PROCEDURE REPORT  PATIENT: Taylor Hall, Taylor Hall  MR#: [id number] BIRTHDATE: [birth year] , 67  yrs. old GENDER: Female ENDOSCOPIST: Hart Carwinora M Mckenzy Salazar, MD REFERRED BY:  Nani Gasseratherine Metheney, M.D. , Jinny Blossomindy Lawrence,Lung transplant coordinator,[facility], [place], [place] PROCEDURE DATE:  06/05/2014 PROCEDURE:  EGD w/ biopsy ASA CLASS:     Class III INDICATIONS:  pre-lung transplant evaluation, hx of GERD/ Nissen fundoplication 2012,. MEDICATIONS: MAC sedation, administered by CRNA TOPICAL ANESTHETIC: none  DESCRIPTION OF PROCEDURE: After the risks benefits and alternatives of the procedure were thoroughly explained, informed consent was obtained.  The Pentax Gastroscope [id number] endoscope was introduced through the mouth and advanced to the second portion of the duodenum. Without limitations.  The instrument was slowly withdrawn as the mucosa was fully examined.      Esophagus: proximal mid and distal esophageal mucosa was normal. There was a slight fibrous ring at the GE junction which was normal obstructing. There was no evidence of acute esophagitis[  ,endoscope traversed into the stomach without resistance. There was a small 1 cm hiatal hernia distal to the squamocolumnar junction Stomach:  the gastric mucosa was unremarkable. The rugal Algis Greenhouse[place]bes and gastric antrum were normal. Biopsies were taken from prepyloric antrum to rule out H. pylori. Retroflexion of the endoscope revealed prior Nissen fundoplication  Duodenum: duodenal bulb and descending duodenum was normal The scope was then withdrawn from the patient and the procedure completed.  COMPLICATIONS: There were no complications. ENDOSCOPIC IMPRESSION:  prior Nissen fundoplication No evidence of flow gastritis. Status post biopsies to rule out H. pylori  RECOMMENDATIONS: 1.  Await pathology results 2.  Anti-reflux regimen to be follow 3.   Continue PPI 4.  Proceed with a Colonoscopy.  REPEAT EXAM:  eSigned:  Hart Carwinora M Akiko Schexnider, MD 06/05/2014 8:29 AM   CC:  PATIENT NAME:  Taylor Hall, Taylor Hall MR#: [id number]

## 2014-06-05 NOTE — Anesthesia Preprocedure Evaluation (Addendum)
Anesthesia Evaluation  Patient identified by MRN, date of birth, ID band Patient awake    Reviewed: Allergy & Precautions, H&P , NPO status , Patient's Chart, lab work & pertinent test results  Airway Mallampati: II TM Distance: >3 FB Neck ROM: Full    Dental no notable dental hx. (+) Upper Dentures   Pulmonary former smoker,  Idiopathic pulmonary fibrosis. Home O2 breath sounds clear to auscultation  Pulmonary exam normal       Cardiovascular hypertension, Pt. on medications DVT Rhythm:Regular Rate:Normal     Neuro/Psych negative neurological ROS  negative psych ROS   GI/Hepatic negative GI ROS, Neg liver ROS,   Endo/Other  negative endocrine ROS  Renal/GU negative Renal ROS  negative genitourinary   Musculoskeletal negative musculoskeletal ROS (+)   Abdominal   Peds negative pediatric ROS (+)  Hematology negative hematology ROS (+)   Anesthesia Other Findings   Reproductive/Obstetrics negative OB ROS                          Anesthesia Physical Anesthesia Plan  ASA: IV  Anesthesia Plan: MAC   Post-op Pain Management:    Induction:   Airway Management Planned:   Additional Equipment:   Intra-op Plan:   Post-operative Plan:   Informed Consent: I have reviewed the patients History and Physical, chart, labs and discussed the procedure including the risks, benefits and alternatives for the proposed anesthesia with the patient or authorized representative who has indicated his/her understanding and acceptance.   Dental advisory given  Plan Discussed with: CRNA  Anesthesia Plan Comments:         Anesthesia Quick Evaluation

## 2014-06-05 NOTE — Transfer of Care (Signed)
Immediate Anesthesia Transfer of Care Note  Patient: Taylor Hall  Procedure(s) Performed: Procedure(s) (LRB): COLONOSCOPY w/ PROPOFOL (N/A) ESOPHAGOGASTRODUODENOSCOPY (EGD) WITH PROPOFOL (N/A)  Patient Location: PACU  Anesthesia Type: MAC  Level of Consciousness: sedated, patient cooperative and responds to stimulation  Airway & Oxygen Therapy: Patient Spontanous Breathing and Patient connected to face mask oxgen  Post-op Assessment: Report given to PACU RN and Post -op Vital signs reviewed and stable  Post vital signs: Reviewed and stable  Complications: No apparent anesthesia complications

## 2014-06-05 NOTE — Discharge Instructions (Signed)

## 2014-06-05 NOTE — Op Note (Signed)
[facility] [address] [place], [id number]   COLONOSCOPY PROCEDURE REPORT  PATIENT: Taylor Hall, Taylor Hall  MR#: [id number] BIRTHDATE: [birth year] , 67  yrs. old GENDER: Female ENDOSCOPIST: Hart Carwinora M Candance Bohlman, MD REFERRED XB:JYNWGNFAOBY:Catherine Linford ArnoldMetheney, M.D. ,. Elliot Cousinindy Lawrence, Lung transplant coordinator, [facility], [place], [place] PROCEDURE DATE:  06/05/2014 PROCEDURE:   Colonoscopy, screening First Screening Colonoscopy - Avg.  risk and is 50 yrs.  old or older - No.  Prior Negative Screening - Now for repeat screening. N/A  History of Adenoma - Now for follow-up colonoscopy & has been > or = to 3 yrs.  Yes hx of adenoma.  Has been 3 or more years since last colonoscopy.  Polyps Removed Today? No.  Recommend repeat exam, <10 yrs? No. ASA CLASS:   Class III INDICATIONS:Pre-  lung transplant evaluation.  History of colon polyps on prior colonoscopy in 2012. MEDICATIONS: MAC sedation, administered by CRNA  DESCRIPTION OF PROCEDURE:   After the risks benefits and alternatives of the procedure were thoroughly explained, informed consent was obtained.  A digital rectal exam revealed no abnormalities of the rectum.   The Pentax Ped Colon [id number] endoscope was introduced through the anus and advanced to the cecum, which was identified by both the appendix and ileocecal valve. No adverse events experienced.   The quality of the prep was good, using MoviPrep  The instrument was then slowly withdrawn as the colon was fully examined.      COLON FINDINGS: There was moderate scattered diverticulosis noted in the descending colon with associated tortuosity and muscular hypertrophy.  Retroflexed views revealed no abnormalities. The time to cecum=5 minutes 25 seconds.  Withdrawal time=7 minutes 17 seconds.  The scope was withdrawn and the procedure completed. COMPLICATIONS: There were no complications.  ENDOSCOPIC IMPRESSION: There was moderate diverticulosis noted in the  descending colon  RECOMMENDATIONS: high-fiber diet Recall colonoscopy in 10 years   eSigned:  Hart Carwinora M Amilee Janvier, MD 06/05/2014 8:35 AM   cc:   PATIENT NAME:  Taylor Hall, Taylor Hall MR#: [id number]

## 2014-06-06 ENCOUNTER — Encounter (HOSPITAL_COMMUNITY): Payer: Self-pay | Admitting: Internal Medicine

## 2014-07-10 ENCOUNTER — Telehealth: Payer: Self-pay | Admitting: Family Medicine

## 2014-07-10 MED ORDER — CLARITHROMYCIN 500 MG PO TABS: 500.0000 mg | ORAL_TABLET | Freq: Two times a day (BID) | ORAL | Status: DC

## 2014-07-10 NOTE — Telephone Encounter (Signed)
I am sorry can you clarify which medication? Antibiotic?

## 2014-07-10 NOTE — Telephone Encounter (Signed)
It was Biaxin. I sent to her pharm.Loralee Pacas [place]

## 2014-07-10 NOTE — Telephone Encounter (Signed)
Taylor Hall called. She would like refill on script for Pulmonary Fibrosis condition. She states when she starts coughing she usually gets a script. Thank you

## 2014-08-08 ENCOUNTER — Telehealth: Payer: Self-pay | Admitting: *Deleted

## 2014-08-08 NOTE — Telephone Encounter (Signed)
Pt called wanting to know if she had gotten her flu shot. She has not she will call back to schedule appt.Taylor PacasBarkley, Taylor Hall [place]

## 2014-08-17 IMAGING — MG MM DIGITAL SCREENING BILAT W/ CAD
5 series · 5 of 5 positions shown · non-contrast
Comparison: Previous exam(s).

CLINICAL DATA: Screening.

EXAM:
DIGITAL SCREENING BILATERAL MAMMOGRAM WITH CAD

[R CC]
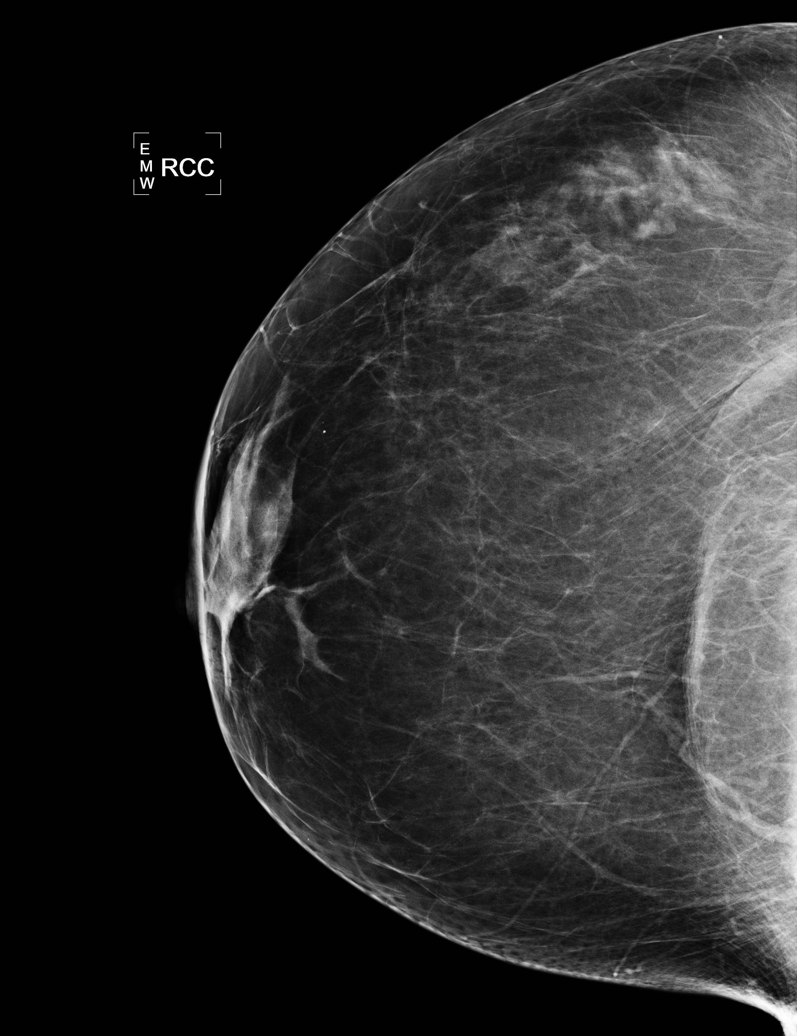

[L CC (1 of 2)]
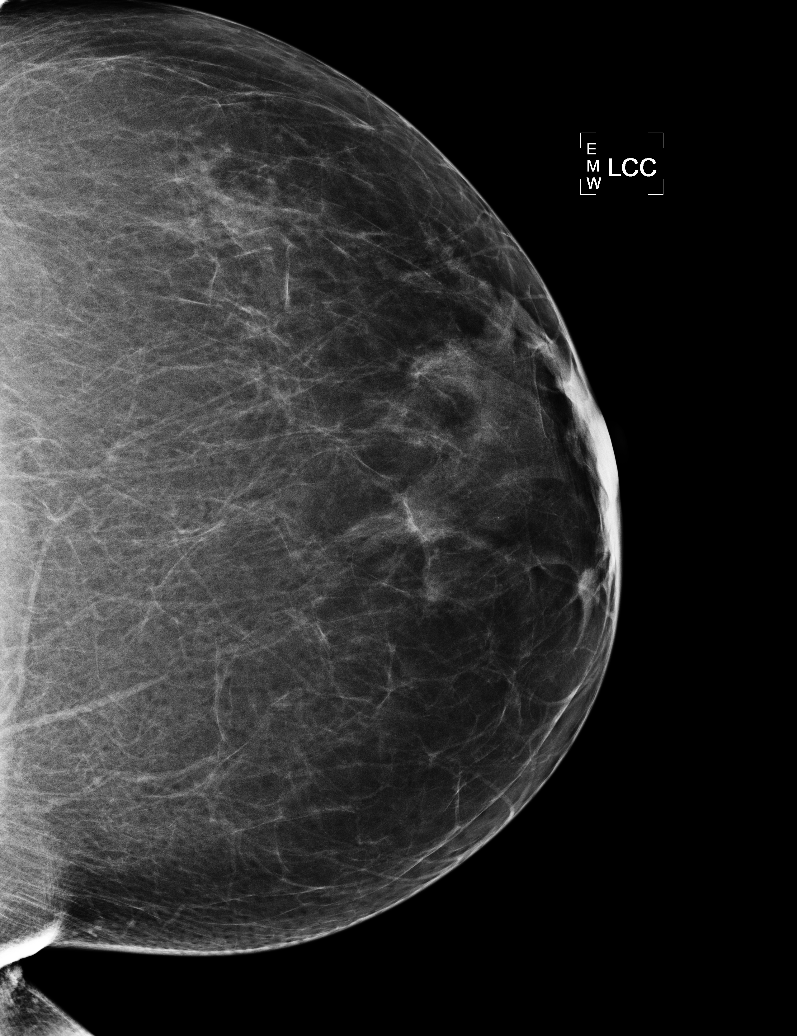

[L MLO]
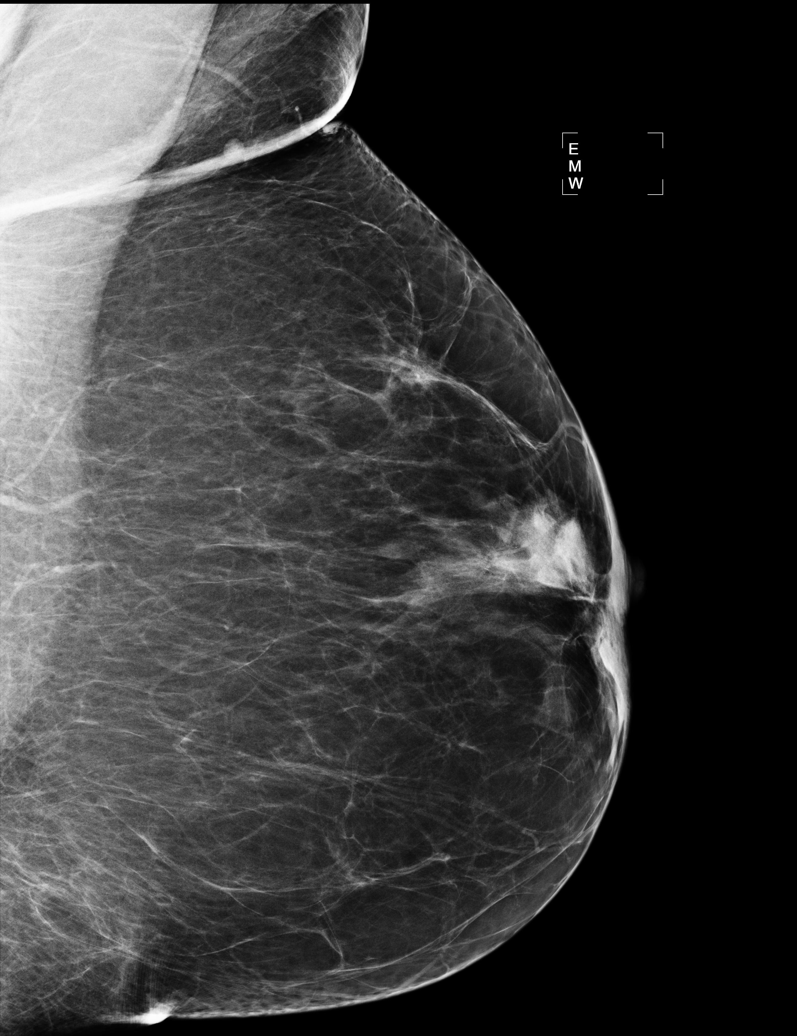

[R MLO]
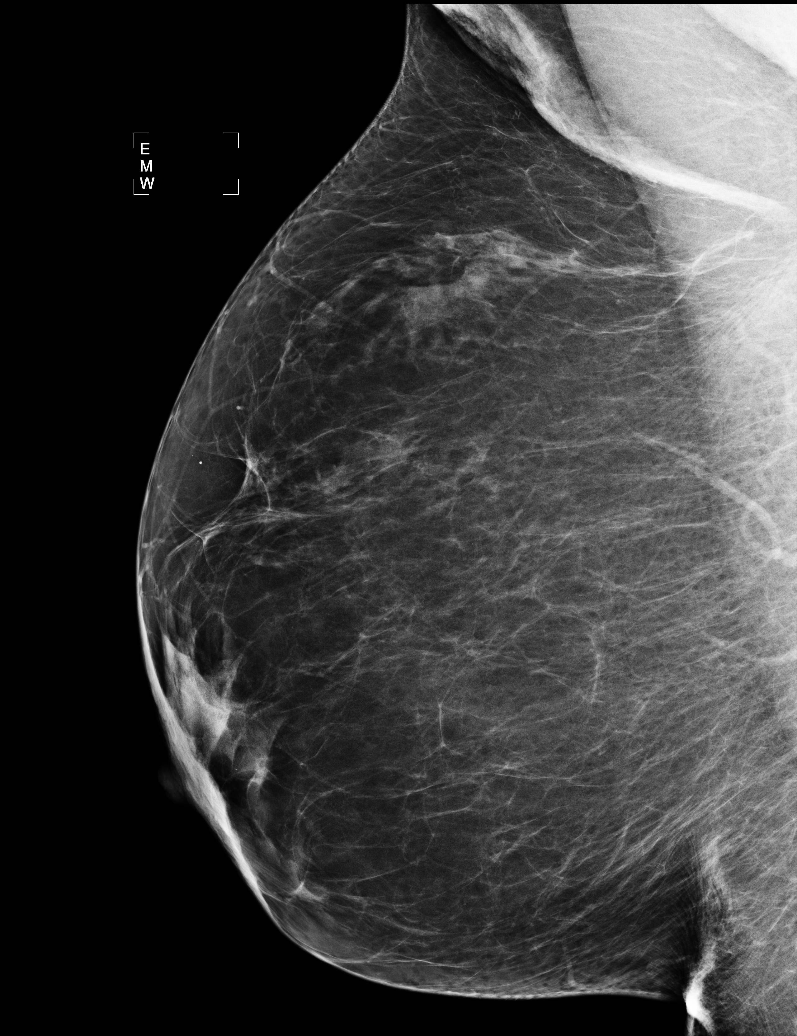

[L CC (2 of 2)]
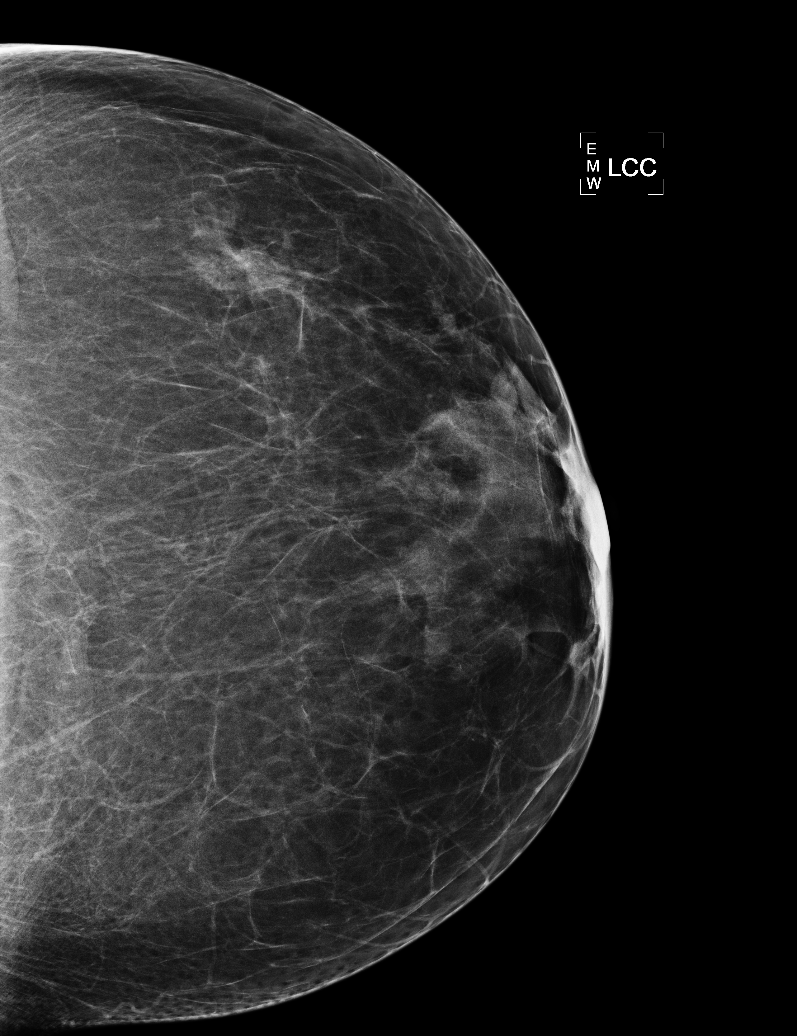

[5 of 5 positions shown; findings below may reference images not displayed]

ACR Breast Density Category b: There are scattered areas of
fibroglandular density.
FINDINGS: There are no findings suspicious for malignancy. Images were
processed with CAD.
IMPRESSION: No mammographic evidence of malignancy. A result letter of this
screening mammogram will be mailed directly to the patient.

RECOMMENDATION:
Screening mammogram in one year. (Code:AS-G-LCT)

BI-RADS CATEGORY  1: Negative.

## 2014-08-27 ENCOUNTER — Other Ambulatory Visit: Payer: Self-pay | Admitting: Family Medicine

## 2014-08-27 MED ORDER — OMEPRAZOLE 20 MG PO CPDR: DELAYED_RELEASE_CAPSULE | ORAL | Status: DC

## 2014-08-27 NOTE — Addendum Note (Signed)
Addended by: Corliss SkainsPAINTER, Johana Hopkinson on: 08/27/2014 05:07 PM   Modules accepted: Orders

## 2014-09-19 ENCOUNTER — Ambulatory Visit (INDEPENDENT_AMBULATORY_CARE_PROVIDER_SITE_OTHER): Payer: Medicare Other | Admitting: *Deleted

## 2014-09-19 ENCOUNTER — Ambulatory Visit: Payer: Medicare Other | Admitting: Family Medicine

## 2014-09-19 VITALS — Temp 98.3°F

## 2014-09-19 DIAGNOSIS — Z23 Encounter for immunization: Secondary | ICD-10-CM

## 2014-09-19 NOTE — Progress Notes (Signed)
Pt here for flu shot. Immunization tolerated well.Taylor Hall  

## 2014-11-08 ENCOUNTER — Other Ambulatory Visit: Payer: Self-pay | Admitting: Family Medicine

## 2014-12-17 ENCOUNTER — Other Ambulatory Visit: Payer: Self-pay | Admitting: Family Medicine

## 2015-01-04 ENCOUNTER — Other Ambulatory Visit: Payer: Self-pay | Admitting: *Deleted

## 2015-01-04 MED ORDER — OMEPRAZOLE 20 MG PO CPDR: DELAYED_RELEASE_CAPSULE | ORAL | Status: AC

## 2015-01-04 MED ORDER — GEMFIBROZIL 600 MG PO TABS: 600.0000 mg | ORAL_TABLET | Freq: Two times a day (BID) | ORAL | Status: DC

## 2015-01-07 ENCOUNTER — Other Ambulatory Visit: Payer: Self-pay | Admitting: Family Medicine

## 2015-01-09 ENCOUNTER — Ambulatory Visit: Payer: Medicare Other

## 2015-01-10 ENCOUNTER — Other Ambulatory Visit: Payer: Self-pay | Admitting: *Deleted

## 2015-01-10 MED ORDER — FLUOXETINE HCL 40 MG PO CAPS: 40.0000 mg | ORAL_CAPSULE | Freq: Every morning | ORAL | Status: DC

## 2015-01-10 NOTE — Telephone Encounter (Signed)
rx sent.Treniece [place] [name]  

## 2015-01-10 NOTE — Telephone Encounter (Signed)
Ms Taylor Hall called requesting a 90 day supply of Prozac to be sent to be sent to :   [id number] - [place], [place] - [address] [phone] (Phone) [phone] (Fax)       Patient said that she will see you on the way [place] from [place]

## 2015-01-21 ENCOUNTER — Telehealth: Payer: Self-pay | Admitting: *Deleted

## 2015-01-21 MED ORDER — FLUOXETINE HCL 20 MG PO TABS: 20.0000 mg | ORAL_TABLET | Freq: Every day | ORAL | Status: DC

## 2015-01-21 NOTE — Telephone Encounter (Signed)
New rx sent for 20mg .

## 2015-01-30 ENCOUNTER — Telehealth: Payer: Self-pay | Admitting: Family Medicine

## 2015-01-30 NOTE — Telephone Encounter (Signed)
Patient is ok with capsules.

## 2015-01-30 NOTE — Telephone Encounter (Signed)
Please call patient: We did receive a letter from her insurance company stating that they will not cover the fluoxetine tabs. They will cover the capsules. We will be happy to send a new prescription for the same strength but in capsules if she is okay with this.

## 2015-02-01 MED ORDER — FLUOXETINE HCL 20 MG PO CAPS: 20.0000 mg | ORAL_CAPSULE | Freq: Every day | ORAL | Status: AC

## 2015-02-01 NOTE — Telephone Encounter (Signed)
New rx sent

## 2015-04-05 ENCOUNTER — Encounter: Payer: Self-pay | Admitting: Family Medicine

## 2015-04-05 ENCOUNTER — Ambulatory Visit (INDEPENDENT_AMBULATORY_CARE_PROVIDER_SITE_OTHER): Payer: Medicare Other | Admitting: Family Medicine

## 2015-04-05 VITALS — Temp 97.9°F

## 2015-04-05 DIAGNOSIS — Z23 Encounter for immunization: Secondary | ICD-10-CM | POA: Diagnosis not present

## 2015-04-05 NOTE — Progress Notes (Signed)
   Subjective:    Patient ID: Taylor Hall, female    DOB: Mar 22, 1946, 69 y.o.   MRN: [id number]  HPI  Taylor Hall was here for the first of 3 Hepatitis B vaccines.   Review of Systems     Objective:   Physical Exam        Assessment & Plan:  Patient tolerated injection well without complications. Patient advised to schedule next injection 30 days from today for 2 nd vaccine.

## 2015-04-15 ENCOUNTER — Other Ambulatory Visit: Payer: Self-pay | Admitting: *Deleted

## 2015-04-15 MED ORDER — GEMFIBROZIL 600 MG PO TABS
600.0000 mg | ORAL_TABLET | Freq: Two times a day (BID) | ORAL | Status: AC
Start: 2015-04-15 — End: ?

## 2015-04-17 ENCOUNTER — Telehealth: Payer: Self-pay | Admitting: *Deleted

## 2015-04-17 NOTE — Telephone Encounter (Signed)
Pt called and requested that a copy of her pap and mammogram be sent to her email: [email or web address].  These were printed out and given to V. Williams to send.Loralee Pacas [place]

## 2015-04-18 ENCOUNTER — Telehealth: Payer: Self-pay | Admitting: *Deleted

## 2015-04-18 NOTE — Telephone Encounter (Signed)
Called pt and informed her that these would be resent to her.Taylor Hall

## 2015-04-18 NOTE — Telephone Encounter (Signed)
Pt lvm indicating that she received the email with the pap results however the mammogram results did not come thru.

## 2015-04-19 ENCOUNTER — Ambulatory Visit (INDEPENDENT_AMBULATORY_CARE_PROVIDER_SITE_OTHER): Payer: Medicare Other | Admitting: Family Medicine

## 2015-04-19 ENCOUNTER — Encounter: Payer: Self-pay | Admitting: Family Medicine

## 2015-04-19 ENCOUNTER — Other Ambulatory Visit (HOSPITAL_COMMUNITY)
Admission: RE | Admit: 2015-04-19 | Discharge: 2015-04-19 | Disposition: A | Discharge status: Home / Self Care | Payer: Medicare Other | Source: Ambulatory Visit | Attending: Family Medicine | Admitting: Family Medicine

## 2015-04-19 VITALS — BP 113/69 | HR 91 | Wt 128.0 lb

## 2015-04-19 DIAGNOSIS — Z1151 Encounter for screening for human papillomavirus (HPV): Secondary | ICD-10-CM | POA: Diagnosis present

## 2015-04-19 DIAGNOSIS — Z111 Encounter for screening for respiratory tuberculosis: Secondary | ICD-10-CM

## 2015-04-19 DIAGNOSIS — J849 Interstitial pulmonary disease, unspecified: Secondary | ICD-10-CM | POA: Diagnosis not present

## 2015-04-19 DIAGNOSIS — Z124 Encounter for screening for malignant neoplasm of cervix: Secondary | ICD-10-CM | POA: Diagnosis present

## 2015-04-19 DIAGNOSIS — Z1231 Encounter for screening mammogram for malignant neoplasm of breast: Secondary | ICD-10-CM

## 2015-04-19 NOTE — Progress Notes (Signed)
Subjective:    Patient ID: Taylor Hall, female    DOB: 09-12-1946, 69 y.o.   MRN: [id number]  HPI  Patient comes in today needing a gynecologic exam as well as a mammogram. She was contacted last Tuesday to say that she is now on the transplant list for lung transplant at [facility]. She has interstitial lung disease and pulmonary hypertension. Over the last month her status has declined. She is now able to use 4 L of oxygen at rest but neat 6 L with activity. She does wear portable oxygen. She is to start rehabilitation on Monday and has to complete a certain number sessions before she can have a transplant. They also have to make sure that her mammogram and Pap smear are cleared an up-to-date. She is not been having any pelvic pain or problems. She has not noticed any abnormal lumps or bumps in the breast tissue. Her last Pap smear was in June 2014 and it was normal. Her last mammogram was in July 2015 and was normal.    Review of Systems   BP 113/69 mmHg  Pulse 91  Wt 128 lb (58.06 kg)    Allergies  Allergen Reactions  . Penicillins Hives  . Simvastatin     REACTION: myalgias    Past Medical History  Diagnosis Date  . Hypertension   . Hyperlipemia   . History of DVT (deep vein thrombosis) 1960    Right lower extremity  . GERD (gastroesophageal reflux disease)   . Anemia   . Blood transfusion without reported diagnosis   . Internal hemorrhoids 03/26/2011  . Colon polyps 03/26/2011  . Diverticulosis     In sigmoid colon  . Idiopathic pulmonary fibrosis   . History of home oxygen therapy     uses oxygen 2 liters/minute prn    Past Surgical History  Procedure Laterality Date  . Varicose veins Right 1970  . Rt rotator cuff      pain  . Cardiac catheterization    . Carpal tunnel release    . Tonsillectomy  1958  . Nissen fundoplication  01/14/2012    Dr. Alphonzo [facility]  . Lung biopsy  01/17/2012  . Hital hernia repair      Also had a nisofundification.   Marland Kitchen Appendectomy  many yrs ago  . Tubal ligation  many yrs ago  . Hernia repair  09/14/2012    done @ [facility] 09/14/2012  . Colonoscopy N/A 06/05/2014    Procedure: COLONOSCOPY w/ PROPOFOL;  Surgeon: Hart Carwin, MD;  Location: [facility];  Service: Endoscopy;  Laterality: N/A;  . Esophagogastroduodenoscopy (egd) with propofol N/A 06/05/2014    Procedure: ESOPHAGOGASTRODUODENOSCOPY (EGD) WITH PROPOFOL;  Surgeon: Hart Carwin, MD;  Location: [facility];  Service: Endoscopy;  Laterality: N/A;    History   Social History  . Marital Status: Married    Spouse Name: N/A  . Number of Children: 2  . Years of Education: N/A   Occupational History  . Retired    Social History Main Topics  . Smoking status: Former Smoker -- 1.00 packs/day for 30 years    Types: Cigarettes    Quit date: 10/26/1980  . Smokeless tobacco: Never Used  . Alcohol Use: Yes     Comment: glass of wine social only  . Drug Use: No  . Sexual Activity: Not on file     Comment: retired, college education, married, does not regularly exercise   Other Topics Concern  .  Not on file   Social History Narrative    Family History  Problem Relation Age of Onset  . Cancer Mother   . Diabetes Mother   . 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 COPD Mother   . Allergies Mother   . Allergies Brother   . Heart disease Father     Outpatient Encounter Prescriptions as of 04/19/2015  Medication Sig  . Acetylcysteine 600 MG CAPS Take 600 mg by mouth 3 (three) times daily.   Marland Kitchen aspirin EC 81 MG tablet Take 81 mg by mouth every morning.  . Calcium-Vitamin D 600-125 MG-UNIT TABS Take 1 tablet by mouth every morning.   . Cholecalciferol 1000 UNITS tablet Take 2,000 Units by mouth daily after supper.  Marland Kitchen FLUoxetine (PROZAC) 20 MG capsule Take 1 capsule (20 mg total) by mouth daily.  Marland Kitchen gemfibrozil (LOPID) 600 MG tablet Take 1 tablet (600 mg total) by mouth 2 (two) times daily.  . Ibuprofen-Diphenhydramine  Cit (ADVIL PM) 200-38 MG TABS Take 1 tablet by mouth every 8 (eight) hours as needed (For sleep.).  Marland Kitchen omeprazole (PRILOSEC) 20 MG capsule TAKE 1 CAPSULE BY MOUTH EVERY DAY  . PRESCRIPTION MEDICATION Take 600 mg by mouth 3 (three) times daily. Pirfenidone    No facility-administered encounter medications on file as of 04/19/2015.          Objective:   Physical Exam  Constitutional: She is oriented to person, place, and time. She appears well-developed and well-nourished.  HENT:  Head: Normocephalic and atraumatic.  Cardiovascular: Normal rate, regular rhythm and normal heart sounds.   Pulmonary/Chest: Effort normal and breath sounds normal.  Abdominal: Soft. Bowel sounds are normal. She exhibits no distension and no mass. There is no tenderness. There is no rebound and no guarding.  Genitourinary: Vagina normal and uterus normal. Rectal exam shows anal tone normal. Cervix exhibits no motion tenderness, no discharge and no friability. Right adnexum displays no mass. Left adnexum displays no mass. No erythema in the vagina. No signs of injury around the vagina. No vaginal discharge found.  Neurological: She is alert and oriented to person, place, and time.  Skin: Skin is warm and dry.  Psychiatric: She has a normal mood and affect. Her behavior is normal.          Assessment & Plan:  Interstitial lung disease-will most likely receive transplant in 1-2 months at [facility]. Wearing 4 liter oxygen today. pulm rehab starts on Monday.   Cervical cancer screening-exam was normal today. Pap smear specimen collected. We will call with results once available.  Mammogram screening ordered. Exam normal today. No personal hx of breast problems.  We will try to get her in today or tomorrow if at all possible.

## 2015-04-22 LAB — QUANTIFERON TB GOLD ASSAY (BLOOD)
Interferon Gamma Release Assay: NEGATIVE
Mitogen value: >10 IU/mL
Quantiferon Nil Value: 0.04 IU/mL
Quantiferon Tb Ag Minus Nil Value: 0.08 IU/mL
TB Ag value: 0.12 IU/mL

## 2015-04-26 LAB — CYTOLOGY - PAP

## 2015-05-01 ENCOUNTER — Telehealth: Payer: Self-pay

## 2015-05-01 NOTE — Telephone Encounter (Signed)
Faxed and routed a copy of pt's pap smear and Quantiferon results to Elliot Cousinindy Lawrence at [facility] per patient request. Also mailed a copy to the pt at [address], [address], [place],  [phone].

## 2015-05-06 ENCOUNTER — Ambulatory Visit: Payer: Medicare Other

## 2015-11-22 ENCOUNTER — Ambulatory Visit: Payer: Medicare Other | Admitting: Internal Medicine

## 2016-04-08 ENCOUNTER — Telehealth: Payer: Self-pay | Admitting: *Deleted

## 2016-04-09 ENCOUNTER — Inpatient Hospital Stay: Payer: Medicare Other | Admitting: Family Medicine

## 2016-04-16 NOTE — Telephone Encounter (Signed)
Chart opened for care everywhere.[name], Dotsie Gillette GraysonLynetta

## 2016-07-26 DEATH — deceased
